# Patient Record
Sex: Female | Born: 1975 | Race: Black or African American | Hispanic: No | Marital: Married | Smoking: Never smoker
Health system: Southern US, Community
[De-identification: ages and names within clinical notes are randomized; demographics above are authoritative.]

## PROBLEM LIST (undated history)

## (undated) DIAGNOSIS — K589 Irritable bowel syndrome without diarrhea: Secondary | ICD-10-CM

## (undated) DIAGNOSIS — F419 Anxiety disorder, unspecified: Secondary | ICD-10-CM

## (undated) DIAGNOSIS — K5900 Constipation, unspecified: Secondary | ICD-10-CM

## (undated) DIAGNOSIS — R600 Localized edema: Secondary | ICD-10-CM

## (undated) DIAGNOSIS — Z803 Family history of malignant neoplasm of breast: Secondary | ICD-10-CM

## (undated) DIAGNOSIS — E0500 Thyrotoxicosis with diffuse goiter without thyrotoxic crisis or storm: Secondary | ICD-10-CM

## (undated) DIAGNOSIS — C801 Malignant (primary) neoplasm, unspecified: Secondary | ICD-10-CM

## (undated) DIAGNOSIS — M2550 Pain in unspecified joint: Secondary | ICD-10-CM

## (undated) DIAGNOSIS — M549 Dorsalgia, unspecified: Secondary | ICD-10-CM

## (undated) DIAGNOSIS — E0590 Thyrotoxicosis, unspecified without thyrotoxic crisis or storm: Secondary | ICD-10-CM

## (undated) DIAGNOSIS — N979 Female infertility, unspecified: Secondary | ICD-10-CM

## (undated) DIAGNOSIS — E559 Vitamin D deficiency, unspecified: Secondary | ICD-10-CM

## (undated) HISTORY — DX: Irritable bowel syndrome, unspecified: K58.9

## (undated) HISTORY — DX: Malignant (primary) neoplasm, unspecified: C80.1

## (undated) HISTORY — DX: Constipation, unspecified: K59.00

## (undated) HISTORY — DX: Thyrotoxicosis, unspecified without thyrotoxic crisis or storm: E05.90

## (undated) HISTORY — DX: Family history of malignant neoplasm of breast: Z80.3

## (undated) HISTORY — DX: Pain in unspecified joint: M25.50

## (undated) HISTORY — PX: ABDOMINAL HYSTERECTOMY: SHX81

## (undated) HISTORY — DX: Localized edema: R60.0

## (undated) HISTORY — PX: APPENDECTOMY: SHX54

## (undated) HISTORY — DX: Anxiety disorder, unspecified: F41.9

## (undated) HISTORY — DX: Female infertility, unspecified: N97.9

## (undated) HISTORY — DX: Vitamin D deficiency, unspecified: E55.9

## (undated) HISTORY — DX: Dorsalgia, unspecified: M54.9

---

## 2003-11-20 DIAGNOSIS — I82409 Acute embolism and thrombosis of unspecified deep veins of unspecified lower extremity: Secondary | ICD-10-CM

## 2003-11-20 HISTORY — DX: Acute embolism and thrombosis of unspecified deep veins of unspecified lower extremity: I82.409

## 2010-11-19 HISTORY — PX: OTHER SURGICAL HISTORY: SHX169

## 2013-11-19 HISTORY — PX: OTHER SURGICAL HISTORY: SHX169

## 2018-12-23 DIAGNOSIS — Z86718 Personal history of other venous thrombosis and embolism: Secondary | ICD-10-CM | POA: Insufficient documentation

## 2019-02-04 ENCOUNTER — Other Ambulatory Visit: Payer: Self-pay

## 2019-02-04 ENCOUNTER — Ambulatory Visit (INDEPENDENT_AMBULATORY_CARE_PROVIDER_SITE_OTHER): Payer: 59 | Admitting: Internal Medicine

## 2019-02-04 ENCOUNTER — Encounter: Payer: Self-pay | Admitting: Internal Medicine

## 2019-02-04 DIAGNOSIS — Z853 Personal history of malignant neoplasm of breast: Secondary | ICD-10-CM | POA: Insufficient documentation

## 2019-02-04 DIAGNOSIS — E0590 Thyrotoxicosis, unspecified without thyrotoxic crisis or storm: Secondary | ICD-10-CM | POA: Diagnosis not present

## 2019-02-04 DIAGNOSIS — E0500 Thyrotoxicosis with diffuse goiter without thyrotoxic crisis or storm: Secondary | ICD-10-CM | POA: Insufficient documentation

## 2019-02-04 LAB — CBC WITH DIFFERENTIAL/PLATELET
BASOS PCT: 0.5 % (ref 0.0–3.0)
Basophils Absolute: 0 10*3/uL (ref 0.0–0.1)
Eosinophils Absolute: 0.1 10*3/uL (ref 0.0–0.7)
Eosinophils Relative: 1 % (ref 0.0–5.0)
HCT: 38.1 % (ref 36.0–46.0)
Hemoglobin: 12.7 g/dL (ref 12.0–15.0)
LYMPHS ABS: 3.5 10*3/uL (ref 0.7–4.0)
Lymphocytes Relative: 40.2 % (ref 12.0–46.0)
MCHC: 33.5 g/dL (ref 30.0–36.0)
MCV: 86.5 fl (ref 78.0–100.0)
Monocytes Absolute: 0.6 10*3/uL (ref 0.1–1.0)
Monocytes Relative: 6.5 % (ref 3.0–12.0)
NEUTROS ABS: 4.5 10*3/uL (ref 1.4–7.7)
Neutrophils Relative %: 51.8 % (ref 43.0–77.0)
PLATELETS: 391 10*3/uL (ref 150.0–400.0)
RBC: 4.4 Mil/uL (ref 3.87–5.11)
RDW: 13.4 % (ref 11.5–15.5)
WBC: 8.6 10*3/uL (ref 4.0–10.5)

## 2019-02-04 LAB — COMPREHENSIVE METABOLIC PANEL
ALT: 6 U/L (ref 0–35)
AST: 11 U/L (ref 0–37)
Albumin: 4.3 g/dL (ref 3.5–5.2)
Alkaline Phosphatase: 67 U/L (ref 39–117)
BUN: 12 mg/dL (ref 6–23)
CO2: 29 mEq/L (ref 19–32)
Calcium: 9.5 mg/dL (ref 8.4–10.5)
Chloride: 103 mEq/L (ref 96–112)
Creatinine, Ser: 0.95 mg/dL (ref 0.40–1.20)
GFR: 78.17 mL/min (ref >60.00)
Glucose, Bld: 91 mg/dL (ref 70–99)
POTASSIUM: 3.7 meq/L (ref 3.5–5.1)
Sodium: 138 mEq/L (ref 135–145)
Total Bilirubin: 0.3 mg/dL (ref 0.2–1.2)
Total Protein: 7.7 g/dL (ref 6.0–8.3)

## 2019-02-04 LAB — T4, FREE: Free T4: 1.04 ng/dL (ref 0.60–1.60)

## 2019-02-04 LAB — TSH: TSH: <0.01 u[IU]/mL — ABNORMAL LOW (ref 0.35–4.50)

## 2019-02-04 NOTE — Progress Notes (Signed)
Name: Madeline Golden  MRN/ DOB: [id number], 10-19-1976    Age/ Sex: 43 y.o., female    PCP: Patient, No Pcp Per   Reason for Endocrinology Evaluation: Hyperthyroidism     Date of Initial Endocrinology Evaluation: 02/04/2019     HPI: [name] is a 43 y.o. female with a past medical history of Graves' disease . The patient presented for initial endocrinology clinic visit on 02/04/2019 for consultative assistance with her hyperthyroidism.   She was diagnosed with hyperthyroidism secondary to graves' disease in 2017, he was initially on methimazole for ~ 1.5 without remission.  She is s/p RAI ablation in 2018 , this was followed by 6 months of biochemical euthyroidism but gradually her TSH started going down and was restarted on methiamzole 5 mg once a day but has been out since 08/2018   Today she denies weight loss, no heat intolerance, anxeity or diarrhea.   Denies local neck symptoms  Has a 35 year old son.   She has noted right eye bulging but no itching or burning  No tobacco use.    HISTORY:   Past Medical History: Graves's Disease, Hx of right breast Ca (S/P right lumpectomy , followed by B/L mastectomy, and chemo)  Past Surgical History:  Past Surgical History:  Procedure Laterality Date   APPENDECTOMY     double mastectomy  2012   hystrectomy with oophrectomy  2015   protective       Social History:  reports that she has never smoked. She has never used smokeless tobacco.  Family History: family history includes Diabetes in her maternal grandmother and mother.   HOME MEDICATIONS: Allergies as of 02/04/2019   Not on File     Medication List       Accurate as of February 04, 2019  1:27 PM. Always use your most recent med list.        methimazole 5 MG tablet Commonly known as:  TAPAZOLE Take 5 mg by mouth 3 (three) times daily.         REVIEW OF SYSTEMS: A comprehensive ROS was conducted with the patient and is negative except as per HPI and  below:  ROS     OBJECTIVE:  VS: BP 116/72 (BP Location: Left Arm, Patient Position: Sitting, Cuff Size: Normal)    Pulse 78    Temp 98 F (36.7 C)    Ht 5\' 7"  (1.702 m)    Wt 223 lb (101.2 kg)    SpO2 98%    BMI 34.93 kg/m    Wt Readings from Last 3 Encounters:  02/04/19 223 lb (101.2 kg)     EXAM: General: Pt appears well and is in NAD  Hydration: Well-hydrated with moist mucous membranes and good skin turgor  Eyes: External eye exam shows b/l stare,no  lid lag , mild right eye exophthalmos.  EOM intact.   Ears, Nose, Throat: Hearing: Grossly intact bilaterally Dental: Good dentition  Throat: Clear without mass, erythema or exudate  Neck: General: Supple without adenopathy. Thyroid: Thyroid size normal.  No goiter or nodules appreciated. No thyroid bruit.  Lungs: Clear with good BS bilat with no rales, rhonchi, or wheezes  Heart: Auscultation: RRR.  Abdomen: Normoactive bowel sounds, soft, nontender, without masses or organomegaly palpable  Extremities:  BL LE: No pretibial edema normal ROM and strength.  Skin: Hair: Texture and amount normal with gender appropriate distribution Skin Inspection: No rashes. Skin Palpation: Skin temperature, texture, and thickness normal to palpation  Neuro: Cranial nerves: II - XII grossly intact  Cerebellar: Normal coordination and movement; no tremor Motor: Normal strength throughout DTRs: 2+ and symmetric in UE without delay in relaxation phase  Mental Status: Judgment, insight: Intact Orientation: Oriented to time, place, and person Mood and affect: No depression, anxiety, or agitation     DATA REVIEWED: 12/24/2018  TSH < 0.01 uIU/mL   Results for Madeline Golden (MRN [id number]) as of 02/05/2019 09:04  Ref. Range 02/04/2019 13:41  Sodium Latest Ref Range: 135 - 145 mEq/L 138  Potassium Latest Ref Range: 3.5 - 5.1 mEq/L 3.7  Chloride Latest Ref Range: 96 - 112 mEq/L 103  CO2 Latest Ref Range: 19 - 32 mEq/L 29  Glucose Latest Ref  Range: 70 - 99 mg/dL 91  BUN Latest Ref Range: 6 - 23 mg/dL 12  Creatinine Latest Ref Range: 0.40 - 1.20 mg/dL 0.95  Calcium Latest Ref Range: 8.4 - 10.5 mg/dL 9.5  Alkaline Phosphatase Latest Ref Range: 39 - 117 U/L 67  Albumin Latest Ref Range: 3.5 - 5.2 g/dL 4.3  AST Latest Ref Range: 0 - 37 U/L 11  ALT Latest Ref Range: 0 - 35 U/L 6  Total Protein Latest Ref Range: 6.0 - 8.3 g/dL 7.7  Total Bilirubin Latest Ref Range: 0.2 - 1.2 mg/dL 0.3  GFR Latest Ref Range: >60.00 mL/min 78.17  WBC Latest Ref Range: 4.0 - 10.5 K/uL 8.6  RBC Latest Ref Range: 3.87 - 5.11 Mil/uL 4.40  Hemoglobin Latest Ref Range: 12.0 - 15.0 g/dL 12.7  HCT Latest Ref Range: 36.0 - 46.0 % 38.1  MCV Latest Ref Range: 78.0 - 100.0 fl 86.5  MCHC Latest Ref Range: 30.0 - 36.0 g/dL 33.5  RDW Latest Ref Range: 11.5 - 15.5 % 13.4  Platelets Latest Ref Range: 150.0 - 400.0 K/uL 391.0  Neutrophils Latest Ref Range: 43.0 - 77.0 % 51.8  Lymphocytes Latest Ref Range: 12.0 - 46.0 % 40.2  Monocytes Relative Latest Ref Range: 3.0 - 12.0 % 6.5  Eosinophil Latest Ref Range: 0.0 - 5.0 % 1.0  Basophil Latest Ref Range: 0.0 - 3.0 % 0.5  NEUT# Latest Ref Range: 1.4 - 7.7 K/uL 4.5  Lymphocyte # Latest Ref Range: 0.7 - 4.0 K/uL 3.5  Monocyte # Latest Ref Range: 0.1 - 1.0 K/uL 0.6  Eosinophils Absolute Latest Ref Range: 0.0 - 0.7 K/uL 0.1  Basophils Absolute Latest Ref Range: 0.0 - 0.1 K/uL 0.0  TSH Latest Ref Range: 0.35 - 4.50 uIU/mL <0.01 (L)  T4,Free(Direct) Latest Ref Range: 0.60 - 1.60 ng/dL 1.04     ASSESSMENT/PLAN/RECOMMENDATIONS:   1. Hyperthyroidism Secondary to Graves' disease:  - She is clinically euthyroid  - S/P RAI ablation in 2018, followed by normal thyroid function for ~ 6 months but had to be restarted on Methimazole due to hyperthyroid status .  - She has been without thionamide therapy since October, 2019 - We discussed that Graves' Disease is a result of an autoimmune condition involving the thyroid.     We discussed with pt the benefits of methimazole in the Tx of hyperthyroidism, as well as the possible side effects/complications of anti-thyroid drug Tx (specifically detailing the rare, but serious side effect of agranulocytosis). She was informed of need for regular thyroid function monitoring while on methimazole to ensure appropriate dosage without over-treatment. As well, we discussed the possible side effects of methimazole including the chance of rash, the small chance of liver irritation/juandice and the <=1 in 300-400 chance of sudden onset  agranulocytosis.  We discussed importance of going to ED promptly (and stopping methimazole) if shewere to develop significant fever with severe sore throat of other evidence of acute infection.      We extensively discussed the various treatment options for hyperthyroidism and Graves disease including ablation therapy with radioactive iodine versus antithyroid drug treatment versus surgical therapy.  We recommended to the patient that we felt, at this time, that I-131 ablation therapy would be most optimal.  We discussed the various possible benefits versus side effects of the various therapies.   I carefully explained to the patient that one of the consequences of I-131 ablation treatment would likely be permanent hypothyroidism which would require long-term replacement therapy with LT4.  - Will proceed with thyroid uptake and scan followed by RAI ablation     F/u in 3 months   Addendum: Discussed results with the patient on 02/04/2019 , will proceed with uptake and scan followed by RAI ablation.   Signed electronically by: Mack Guise, MD  [facility]  [facility] [address] [place], [place] [address] Phone: [phone] FAX: [phone]   CC: Patient, No Pcp Per No address on file Phone: None Fax: None   Return to Endocrinology clinic as below: No future appointments.

## 2019-02-04 NOTE — Patient Instructions (Signed)
-   Please stop by the lab today, if your thyroid continues to be overactive , will proceed with thyroid uptake and scan followed by Radioactive iodine ablation.     Thyroid Uptake and Scan works like this: We would first check a thyroid "scan" (a special, but easy and painless type of thyroid x ray).  you go to the x-ray department of the hospital to swallow a pill, which contains a miniscule amount of radiation.  You will not notice any symptoms from this.  You will go back to the x-ray department the next day, to lie down in front of a camera.  The results of this will be sent to me.

## 2019-02-09 LAB — TRAB (TSH RECEPTOR BINDING ANTIBODY): TRAB: 14.72 IU/L — ABNORMAL HIGH (ref <=2.00)

## 2019-03-24 ENCOUNTER — Encounter (HOSPITAL_COMMUNITY): Payer: 59

## 2019-03-25 ENCOUNTER — Encounter (HOSPITAL_COMMUNITY): Payer: 59

## 2019-04-16 ENCOUNTER — Other Ambulatory Visit: Payer: Self-pay

## 2019-04-16 ENCOUNTER — Encounter (HOSPITAL_COMMUNITY)
Admission: RE | Admit: 2019-04-16 | Discharge: 2019-04-16 | Disposition: A | Discharge status: Home / Self Care | Payer: 59 | Source: Ambulatory Visit | Attending: Internal Medicine | Admitting: Internal Medicine

## 2019-04-16 DIAGNOSIS — E0500 Thyrotoxicosis with diffuse goiter without thyrotoxic crisis or storm: Secondary | ICD-10-CM | POA: Insufficient documentation

## 2019-04-16 MED ORDER — SODIUM IODIDE I 131 CAPSULE
460.0000 | Freq: Once | INTRAVENOUS | Status: AC | PRN
  Administered 2019-04-16: 460 via ORAL

## 2019-04-17 ENCOUNTER — Encounter (HOSPITAL_COMMUNITY)
Admission: RE | Admit: 2019-04-17 | Discharge: 2019-04-17 | Disposition: A | Discharge status: Home / Self Care | Payer: 59 | Source: Ambulatory Visit | Attending: Internal Medicine | Admitting: Internal Medicine

## 2019-04-21 ENCOUNTER — Telehealth: Payer: Self-pay | Admitting: Internal Medicine

## 2019-04-21 NOTE — Telephone Encounter (Signed)
Discussed thyroid up take and scan results.   Results were normal no indication for therapy.   Pt under the impression she received RAI ablation treatment this time but I explained to her with the results being normal , she did not receive any RAI ablation.    We will obtain her records from [place], to confirm prior RAI ablation rather then just a thyroid uptake and scan     Abby Nena Jordan, MD  [facility]  [facility] [address] [place], [place] [address] Phone: [phone] FAX: [phone]

## 2019-05-06 ENCOUNTER — Encounter: Payer: Self-pay | Admitting: Internal Medicine

## 2019-05-06 ENCOUNTER — Ambulatory Visit (INDEPENDENT_AMBULATORY_CARE_PROVIDER_SITE_OTHER): Payer: 59 | Admitting: Internal Medicine

## 2019-05-06 ENCOUNTER — Other Ambulatory Visit: Payer: Self-pay

## 2019-05-06 VITALS — BP 110/80 | HR 74 | Temp 98.2°F | Ht 67.0 in | Wt 223.0 lb

## 2019-05-06 DIAGNOSIS — E0590 Thyrotoxicosis, unspecified without thyrotoxic crisis or storm: Secondary | ICD-10-CM | POA: Diagnosis not present

## 2019-05-06 LAB — TSH: TSH: 0.03 u[IU]/mL — ABNORMAL LOW (ref 0.35–4.50)

## 2019-05-06 LAB — T4, FREE: Free T4: 0.98 ng/dL (ref 0.60–1.60)

## 2019-05-06 NOTE — Patient Instructions (Signed)
-   Please notify us for unexplained weight loss, flutters in your heart, diarrhea, or anxiety with jittery sensation

## 2019-05-06 NOTE — Progress Notes (Signed)
Name: Madeline Golden  MRN/ DOB: [id number], Mar 03, 1976    Age/ Sex: 43 y.o., female     PCP: Patient, No Pcp Per   Reason for Endocrinology Evaluation: hyperthyroidism     Initial Endocrinology Clinic Visit: 02/04/2019    PATIENT IDENTIFIER: [name] is a 43 y.o., female with a past medical history of Graves' Disease, Breat Ca (S/P mastectomy and sx) She has followed with [facility] since 02/04/2019 for consultative assistance with management of her hyperthyroidism.   HISTORICAL SUMMARY:  She was diagnosed with hyperthyroidism secondary to graves' disease in 2017, he was initially on methimazole for ~ 1.5 years without remission.  She was under the impression that she had RAI ablation in 2018 but this was a thyroid uptake and scan, gradually her TSH started going down and was restarted on methiamzole  But by her initial visit to our clinic she was off of methimazole again.   Due to normal FT4 with a suppressed TSH, we proceed with thyroid uptake and scan on 04/16/2019 with normal uptake with a 24-hr of 28.8%   SUBJECTIVE:    Today (05/06/2019):  [name] is here for a 3 month follow up on hyperthyroidism secondary to graves' disease.  She denies any weight loss, palpitation , anxiety or diarrhea.   She has right eye proptosis but has been stable for the past year.   ROS:  As per HPI.   HISTORY:   Past Medical History: No past medical history on file. Past Surgical History:  Past Surgical History:  Procedure Laterality Date  . APPENDECTOMY    . double mastectomy  2012  . hystrectomy with oophrectomy  2015   protective     Social History:  reports that she has never smoked. She has never used smokeless tobacco. No history on file for alcohol and drug. Family History:  Family History  Problem Relation Age of Onset  . Diabetes Mother   . Breast cancer Mother   . Diabetes Maternal Grandmother   . Lung cancer Father      HOME MEDICATIONS:  Allergies as of 05/06/2019   No Known Allergies     Medication List    as of May 06, 2019  1:29 PM   You have not been prescribed any medications.       OBJECTIVE:   PHYSICAL EXAM: VS: BP 110/80 (BP Location: Left Arm, Patient Position: Sitting, Cuff Size: Large)   Pulse 74   Temp 98.2 F (36.8 C) (Oral)   Ht 5\' 7"  (1.702 m)   Wt 223 lb (101.2 kg)   SpO2 97%   BMI 34.93 kg/m    EXAM: General: Pt appears well and is in NAD  Eyes: External eye exam shows minimal right eye protosis with a stare, but no  lid lag Left eye exam normal   EOM intact.  PERRL.  Ears, Nose, Throat: Hearing: Grossly intact bilaterally Dental: Good dentition  Throat: Clear without mass, erythema or exudate  Neck: General: Supple without adenopathy. Thyroid: Thyroid size normal.  No goiter or nodules appreciated. No thyroid bruit.  Lungs: Clear with good BS bilat with no rales, rhonchi, or wheezes  Heart: Auscultation: RRR.  Abdomen: Normoactive bowel sounds, soft, nontender, without masses or organomegaly palpable  Extremities:  BL LE: No pretibial edema normal ROM and strength.  Mental Status: Judgment, insight: Intact Orientation: Oriented to time, place, and person Mood and affect: No depression, anxiety, or agitation     DATA REVIEWED:  12/24/2018 TSH < 0.01 uIU/mL   Thyroid uptake and scan 04/16/2019 24 hour I-123 uptake = 28.8% (normal 10-30%)   Results for Madeline, Golden (MRN [id number]) as of 05/07/2019 12:04  Ref. Range 05/06/2019 13:48  TSH Latest Ref Range: 0.35 - 4.50 uIU/mL 0.03 (L)  Triiodothyronine (T3) Latest Ref Range: 76 - 181 ng/dL 119  T4,Free(Direct) Latest Ref Range: 0.60 - 1.60 ng/dL 0.98   ASSESSMENT / PLAN / RECOMMENDATIONS:   1. Subclinical Graves' Disease:   - Clinically she is euthyroid - No local neck symptoms.  - Thyroid uptake and scan 03/2019 was normal.  - TFT's stable, no intervention needed   2. Graves' Disease:  Pt with evidence of right eye  proptosis, she was urged to establish with a local ophthalmologist.   F/u in 6 months    Signed electronically by: Mack Guise, MD  [facility]  [facility] [address] [place], [place] [address] Phone: [phone] FAX: [phone]      CC: Patient, No Pcp Per No address on file Phone: None  Fax: None   Return to Endocrinology clinic as below: No future appointments.

## 2019-05-07 ENCOUNTER — Encounter: Payer: Self-pay | Admitting: Internal Medicine

## 2019-05-07 LAB — T3: T3, Total: 119 ng/dL (ref 76–181)

## 2019-11-04 ENCOUNTER — Other Ambulatory Visit: Payer: Self-pay

## 2019-11-06 ENCOUNTER — Other Ambulatory Visit: Payer: Self-pay

## 2019-11-06 ENCOUNTER — Ambulatory Visit: Payer: 59 | Admitting: Internal Medicine

## 2019-11-06 ENCOUNTER — Encounter: Payer: Self-pay | Admitting: Internal Medicine

## 2019-11-06 VITALS — BP 116/72 | HR 77 | Temp 98.3°F | Ht 67.0 in | Wt 227.6 lb

## 2019-11-06 DIAGNOSIS — E0500 Thyrotoxicosis with diffuse goiter without thyrotoxic crisis or storm: Secondary | ICD-10-CM | POA: Diagnosis not present

## 2019-11-06 LAB — CBC WITH DIFFERENTIAL/PLATELET
Basophils Absolute: 0.1 10*3/uL (ref 0.0–0.1)
Basophils Relative: 0.6 % (ref 0.0–3.0)
Eosinophils Absolute: 0.1 10*3/uL (ref 0.0–0.7)
Eosinophils Relative: 0.8 % (ref 0.0–5.0)
HCT: 35.7 % — ABNORMAL LOW (ref 36.0–46.0)
Hemoglobin: 12 g/dL (ref 12.0–15.0)
Lymphocytes Relative: 39.2 % (ref 12.0–46.0)
Lymphs Abs: 4.1 10*3/uL — ABNORMAL HIGH (ref 0.7–4.0)
MCHC: 33.6 g/dL (ref 30.0–36.0)
MCV: 86.1 fl (ref 78.0–100.0)
Monocytes Absolute: 0.5 10*3/uL (ref 0.1–1.0)
Monocytes Relative: 5 % (ref 3.0–12.0)
Neutro Abs: 5.7 10*3/uL (ref 1.4–7.7)
Neutrophils Relative %: 54.4 % (ref 43.0–77.0)
Platelets: 385 10*3/uL (ref 150.0–400.0)
RBC: 4.15 Mil/uL (ref 3.87–5.11)
RDW: 13.9 % (ref 11.5–15.5)
WBC: 10.5 10*3/uL (ref 4.0–10.5)

## 2019-11-06 LAB — COMPREHENSIVE METABOLIC PANEL
ALT: 5 U/L (ref 0–35)
AST: 12 U/L (ref 0–37)
Albumin: 4.3 g/dL (ref 3.5–5.2)
Alkaline Phosphatase: 62 U/L (ref 39–117)
BUN: 9 mg/dL (ref 6–23)
CO2: 27 mEq/L (ref 19–32)
Calcium: 9.4 mg/dL (ref 8.4–10.5)
Chloride: 103 mEq/L (ref 96–112)
Creatinine, Ser: 0.95 mg/dL (ref 0.40–1.20)
GFR: 77.89 mL/min (ref >60.00)
Glucose, Bld: 97 mg/dL (ref 70–99)
Potassium: 3.7 mEq/L (ref 3.5–5.1)
Sodium: 136 mEq/L (ref 135–145)
Total Bilirubin: 0.4 mg/dL (ref 0.2–1.2)
Total Protein: 7.5 g/dL (ref 6.0–8.3)

## 2019-11-06 LAB — TSH: TSH: 0.19 u[IU]/mL — ABNORMAL LOW (ref 0.35–4.50)

## 2019-11-06 LAB — T4, FREE: Free T4: 0.89 ng/dL (ref 0.60–1.60)

## 2019-11-06 NOTE — Progress Notes (Signed)
Name: Madeline Golden  MRN/ DOB: [id number], 1976-04-30    Age/ Sex: 43 y.o., female     PCP: Patient, No Pcp Per   Reason for Endocrinology Evaluation: hyperthyroidism     Initial Endocrinology Clinic Visit: 02/04/2019    PATIENT IDENTIFIER: [name] is a 43 y.o., female with a past medical history of Graves' Disease, Breat Ca (S/P mastectomy and sx) She has followed with [facility] since 02/04/2019 for consultative assistance with management of her hyperthyroidism.   HISTORICAL SUMMARY:  She was diagnosed with hyperthyroidism secondary to graves' disease in 2017, he was initially on methimazole for ~ 1.5 years without remission.  She was under the impression that she had RAI ablation in 2018 but this was a thyroid uptake and scan, gradually her TSH started going down and was restarted on methiamzole  but by her initial visit to our clinic she was off of methimazole again.   Due to normal FT4 with a suppressed TSH, we proceed with thyroid uptake and scan on 04/16/2019 with normal uptake with a 24-hr of 28.8%   SUBJECTIVE:   During last visit (05/06/2019): TSH 0.03 uIU/mL with normal FT4. No treatment offered.   Today (11/09/2019):  [name] is here for a 6 month follow up on hyperthyroidism secondary to graves' disease.  She denies any weight loss, palpitation , anxiety or diarrhea.   She has right eye proptosis but has been stable for the past years  Denies local neck symptoms   She is S/P hystrectomy/oophrectomy  in 2015  ROS:  As per HPI.   HISTORY:   Past Medical History: No past medical history on file. Past Surgical History:  Past Surgical History:  Procedure Laterality Date  . APPENDECTOMY    . double mastectomy  2012  . hystrectomy with oophrectomy  2015   protective     Social History:  reports that she has never smoked. She has never used smokeless tobacco. No history on file for alcohol and drug. Family History:  Family History    Problem Relation Age of Onset  . Diabetes Mother   . Breast cancer Mother   . Diabetes Maternal Grandmother   . Lung cancer Father      HOME MEDICATIONS: Allergies as of 11/06/2019   No Known Allergies     Medication List    as of November 06, 2019 11:59 PM   You have not been prescribed any medications.       OBJECTIVE:   PHYSICAL EXAM: VS: BP 116/72 (BP Location: Left Arm, Patient Position: Sitting, Cuff Size: Large)   Pulse 77   Temp 98.3 F (36.8 C)   Ht 5\' 7"  (1.702 m)   Wt 227 lb 9.6 oz (103.2 kg)   SpO2 98%   BMI 35.65 kg/m    EXAM: General: Pt appears well and is in NAD  Eyes: External eye exam shows right eye protosis with a stare, but no  lid lag Left eye exam normal   EOM intact.    Neck: General: Supple without adenopathy. Thyroid: Thyroid size normal.  No goiter or nodules appreciated. No thyroid bruit.  Lungs: Clear with good BS bilat with no rales, rhonchi, or wheezes  Heart: Auscultation: RRR.  Abdomen: Normoactive bowel sounds, soft, nontender, without masses or organomegaly palpable  Extremities:  BL LE: No pretibial edema normal ROM and strength.  Mental Status: Judgment, insight: Intact Orientation: Oriented to time, place, and person Mood and affect: No depression, anxiety, or  agitation     DATA REVIEWED: Results for CAPRI, HUPPERT (MRN [id number]) as of 11/09/2019 09:21  Ref. Range 11/06/2019 13:36  TSH Latest Ref Range: 0.35 - 4.50 uIU/mL 0.19 (L)  T4,Free(Direct) Latest Ref Range: 0.60 - 1.60 ng/dL 0.89    Results for DALAYAH, STALLING (MRN [id number]) as of 11/09/2019 09:21  Ref. Range 02/04/2019 13:41  TRAB Latest Ref Range: <=2.00 IU/L 14.72 (H)    Thyroid uptake and scan 04/16/2019 24 hour I-123 uptake = 28.8% (normal 10-30%)     ASSESSMENT / PLAN / RECOMMENDATIONS:   1. Subclinical Graves' Disease:   - Clinically she is euthyroid - No local neck symptoms.  - Thyroid uptake and scan 03/2019 was normal but had elevated  TRAb - Most patients with subclinical hyperthyroidism have no clinical manifestations of hyperthyroidism, and those symptoms that are present (eg, tachycardia, tremor, dyspnea on exertion, weight loss) are mild and nonspecific." However, subclinical hyperthyroidism is associated with an increased risk of atrial fibrillation, and primarily in postmenopausal women, a decrease in bone mineral density. Since the patient is S/P oophrecotmy in 2015, she is at high risk for worsening BMD and I have recommended thionamide therapy if TSH remains low as she has not went in to remission for the past year.    Medications  Methimazole 5 mg daily   2. Graves' Disease:  Pt with evidence of right eye proptosis, she was urged again to establish with a local ophthalmologist.   F/u in 6 months   Addendum: discussed results with the pt on 11/09/2019. Agreed to start methimazole    Signed electronically by: Mack Guise, MD  [facility] End[facility]  [facility] [address] [place], [place] [address] Phone: [phone] FAX: [phone]      CC: Patient, No Pcp Per No address on file Phone: None  Fax: None   Return to Endocrinology clinic as below: Future Appointments  Date Time Provider [place]  05/13/2020  1:00 PM Joelle Flessner, Melanie Crazier, MD [id number] None

## 2019-11-09 ENCOUNTER — Encounter: Payer: Self-pay | Admitting: Internal Medicine

## 2019-11-09 MED ORDER — METHIMAZOLE 5 MG PO TABS: 5.0000 mg | ORAL_TABLET | Freq: Three times a day (TID) | ORAL | 1 refills | Status: DC

## 2020-03-10 ENCOUNTER — Ambulatory Visit
Admission: EM | Admit: 2020-03-10 | Discharge: 2020-03-10 | Disposition: A | Discharge status: Home / Self Care | Payer: 59 | Attending: Emergency Medicine | Admitting: Emergency Medicine

## 2020-03-10 DIAGNOSIS — M546 Pain in thoracic spine: Secondary | ICD-10-CM

## 2020-03-10 DIAGNOSIS — G8929 Other chronic pain: Secondary | ICD-10-CM | POA: Diagnosis not present

## 2020-03-10 HISTORY — DX: Thyrotoxicosis with diffuse goiter without thyrotoxic crisis or storm: E05.00

## 2020-03-10 MED ORDER — NAPROXEN 500 MG PO TABS: 500.0000 mg | ORAL_TABLET | Freq: Two times a day (BID) | ORAL | 0 refills | Status: DC

## 2020-03-10 MED ORDER — CYCLOBENZAPRINE HCL 5 MG PO TABS: 5.0000 mg | ORAL_TABLET | Freq: Two times a day (BID) | ORAL | 0 refills | Status: AC | PRN

## 2020-03-10 NOTE — ED Triage Notes (Signed)
Pt c/o rt upper/mid back pain for the past 77months that has gradually gotten worse. Pt denies injury.

## 2020-03-10 NOTE — ED Provider Notes (Signed)
[name]    CSN: [id number] Arrival date & time: 03/10/20  1730      History   Chief Complaint Chief Complaint  Patient presents with  . Back Pain    HPI Madeline Golden is a 44 y.o. female with history of hypothyroidism second to Graves' disease, history of breast cancer presenting for right-sided thoracic back pain for the last 3 months.  States is gradually gotten worse.  Denies injury, inciting event, overuse.  Has tried her mother's hydrocodone with some relief.  States she rarely uses this.  Has tried icy hot as well without relief.  Denies chest pain, difficulty breathing, unintentional weight loss or night sweats.  Patient compliant with methimazole and followed routinely by her endocrinologist: No recent change in medications.   Past Medical History:  Diagnosis Date  . Graves disease     Patient Active Problem List   Diagnosis Date Noted  . Hyperthyroidism 02/04/2019  . Graves disease 02/04/2019  . Hx of breast cancer 02/04/2019    Past Surgical History:  Procedure Laterality Date  . ABDOMINAL HYSTERECTOMY    . APPENDECTOMY    . double mastectomy  2012  . hystrectomy with oophrectomy  2015   protective     OB History   No obstetric history on file.      Home Medications    Prior to Admission medications   Medication Sig Start Date End Date Taking? Authorizing Provider  cyclobenzaprine (FLEXERIL) 5 MG tablet Take 1 tablet (5 mg total) by mouth 2 (two) times daily as needed for up to 5 days for muscle spasms. 03/10/20 03/15/20  Hall-Potvin, [place], PA-C  methimazole (TAPAZOLE) 5 MG tablet Take 1 tablet (5 mg total) by mouth 3 (three) times daily. 11/09/19   Shamleffer, Melanie Crazier, MD  naproxen (NAPROSYN) 500 MG tablet Take 1 tablet (500 mg total) by mouth 2 (two) times daily. 03/10/20   Hall-Potvin, [place], PA-C    Family History Family History  Problem Relation Age of Onset  . Diabetes Mother   . Breast cancer Mother   . Diabetes  Maternal Grandmother   . Lung cancer Father     Social History Social History   Tobacco Use  . Smoking status: Never Smoker  . Smokeless tobacco: Never Used  Substance Use Topics  . Alcohol use: Yes  . Drug use: Never     Allergies   Patient has no known allergies.   Review of Systems As per HPI   Physical Exam Triage Vital Signs ED Triage Vitals  Enc Vitals Group     BP      Pulse      Resp      Temp      Temp src      SpO2      Weight      Height      Head Circumference      Peak Flow      Pain Score      Pain Loc      Pain Edu?      Excl. in [place]?    No data found.  Updated Vital Signs BP 119/79 (BP Location: Left Arm)   Pulse 80   Temp 98.3 F (36.8 C) (Oral)   Resp 16   SpO2 98%   Visual Acuity Right Eye Distance:   Left Eye Distance:   Bilateral Distance:    Right Eye Near:   Left Eye Near:    Bilateral Near:  Physical Exam Constitutional:      General: She is not in acute distress.    Appearance: She is obese. She is not ill-appearing.  HENT:     Head: Normocephalic and atraumatic.  Eyes:     General: No scleral icterus.    Pupils: Pupils are equal, round, and reactive to light.  Cardiovascular:     Rate and Rhythm: Normal rate and regular rhythm.     Heart sounds: Normal heart sounds.  Pulmonary:     Effort: Pulmonary effort is normal. No respiratory distress.     Breath sounds: No wheezing or rales.  Musculoskeletal:     Cervical back: Neck supple. No tenderness.     Comments: Thoracic back pain right of midline.  No edema, crepitus.  Moderate muscle tension appreciated as compared to left.  Lymphadenopathy:     Cervical: No cervical adenopathy.  Skin:    Capillary Refill: Capillary refill takes less than 2 seconds.     Coloration: Skin is not jaundiced or pale.  Neurological:     General: No focal deficit present.     Mental Status: She is alert and oriented to person, place, and time.      UC Treatments / Results    Labs (all labs ordered are listed, but only abnormal results are displayed) Labs Reviewed - No data to display  EKG   Radiology No results found.  Procedures Procedures (including critical care time)  Medications Ordered in UC Medications - No data to display  Initial Impression / Assessment and Plan / UC Course  I have reviewed the triage vital signs and the nursing notes.  Pertinent labs & imaging results that were available during my care of the patient were reviewed by me and considered in my medical decision making (see chart for details).     Patient afebrile, nontoxic, and hemodynamically stable in office today.  No systemic symptoms as outlined in HPI.  Will treat for musculoskeletal as outlined below, have patient follow-up with PCP for further evaluation if needed.  Contact information provided.  Return precautions discussed, patient verbalized understanding and is agreeable to plan. Final Clinical Impressions(s) / UC Diagnoses   Final diagnoses:  Chronic right-sided thoracic back pain     Discharge Instructions     Recommend RICE: rest, ice, compression, elevation as needed for pain.    Heat therapy (hot compress, warm wash rag, hot showers, etc.) can help relax muscles and soothe muscle aches. Cold therapy (ice packs) can be used to help swelling both after injury and after prolonged use of areas of chronic pain/aches.  For pain: take naproxen twice daily with food    ED Prescriptions    Medication Sig Dispense Auth. Provider   naproxen (NAPROSYN) 500 MG tablet Take 1 tablet (500 mg total) by mouth 2 (two) times daily. 30 tablet Hall-Potvin, [place], PA-C   cyclobenzaprine (FLEXERIL) 5 MG tablet Take 1 tablet (5 mg total) by mouth 2 (two) times daily as needed for up to 5 days for muscle spasms. 10 tablet Hall-Potvin, [place], PA-C     I have reviewed the PDMP during this encounter.   Hall-Potvin, [place], [place] 03/10/20 1820

## 2020-03-10 NOTE — Discharge Instructions (Addendum)
Recommend RICE: rest, ice, compression, elevation as needed for pain.    Heat therapy (hot compress, warm wash rag, hot showers, etc.) can help relax muscles and soothe muscle aches. Cold therapy (ice packs) can be used to help swelling both after injury and after prolonged use of areas of chronic pain/aches.  For pain: take naproxen twice daily with food

## 2020-03-28 ENCOUNTER — Encounter: Payer: Self-pay | Admitting: Internal Medicine

## 2020-03-29 ENCOUNTER — Telehealth: Payer: Self-pay

## 2020-03-29 NOTE — Telephone Encounter (Signed)

## 2020-03-29 NOTE — Patient Instructions (Signed)
Thank you for choosing Primary Care at [facility] to be your medical home!    Bernelda Bynog was seen by Melina Schools, DO today.   Earline Mayotte primary care provider is Phill Myron, DO.   For the best care possible, you should try to see Phill Myron, DO whenever you come to the clinic.   We look forward to seeing you again soon!  If you have any questions about your visit today, please call us at [phone] or feel free to reach your primary care provider via [facility].

## 2020-03-30 ENCOUNTER — Ambulatory Visit (INDEPENDENT_AMBULATORY_CARE_PROVIDER_SITE_OTHER): Payer: 59 | Admitting: Internal Medicine

## 2020-03-30 ENCOUNTER — Ambulatory Visit (INDEPENDENT_AMBULATORY_CARE_PROVIDER_SITE_OTHER): Payer: 59

## 2020-03-30 ENCOUNTER — Encounter: Payer: Self-pay | Admitting: Internal Medicine

## 2020-03-30 ENCOUNTER — Other Ambulatory Visit: Payer: Self-pay

## 2020-03-30 VITALS — BP 111/76 | HR 80 | Temp 98.2°F | Resp 17 | Ht 70.0 in | Wt 223.0 lb

## 2020-03-30 DIAGNOSIS — M546 Pain in thoracic spine: Secondary | ICD-10-CM

## 2020-03-30 DIAGNOSIS — M25561 Pain in right knee: Secondary | ICD-10-CM | POA: Diagnosis not present

## 2020-03-30 DIAGNOSIS — G8929 Other chronic pain: Secondary | ICD-10-CM

## 2020-03-30 DIAGNOSIS — Z7689 Persons encountering health services in other specified circumstances: Secondary | ICD-10-CM | POA: Diagnosis not present

## 2020-03-30 MED ORDER — CYCLOBENZAPRINE HCL 5 MG PO TABS: 5.0000 mg | ORAL_TABLET | Freq: Three times a day (TID) | ORAL | 1 refills | Status: DC | PRN

## 2020-03-30 NOTE — Progress Notes (Signed)
  Subjective:    Madeline Golden - 44 y.o. female MRN [id number]  Date of birth: Jul 01, 1976  HPI  Rigley Prem is to establish care. Patient has a PMH significant for breast cancer, Graves disease, unprovoked DVT in 2005.    Back Pain: Chronic pain present on right side in thoracic region. Present daily throughout the day. Has been present for 3-4 months. No known injury or trauma. Reports that initially noticed it at first when she was in the bed. No changes in bowel or bladder. No fevers. Diagnosed with breast cancer in 2009. Finished treatment in 2013. Taking Flexeril and Naproxen with some relief.   Right knee pain. Reports it is "raggedy". It comes and goes. She will hear popping and crunching. Occasionally will swell. Chronic for years.   ROS per HPI     Health Maintenance:  Health Maintenance Due  Topic Date Due  . COVID-19 Vaccine (1) Never done  . TETANUS/TDAP  Never done     Past Medical History: Patient Active Problem List   Diagnosis Date Noted  . Hyperthyroidism 02/04/2019  . Graves disease 02/04/2019  . Hx of breast cancer 02/04/2019      Social History   reports that she has never smoked. She has never used smokeless tobacco. She reports current alcohol use. She reports that she does not use drugs.   Family History  family history includes Bone cancer in her father; Breast cancer in her maternal grandmother and mother; Diabetes in her maternal grandmother and mother; Lung cancer in her father and paternal aunt.   Medications: reviewed and updated   Objective:   Physical Exam BP 111/76   Pulse 80   Temp 98.2 F (36.8 C) (Temporal)   Resp 17   Ht 5\' 10"  (1.778 m)   Wt 223 lb (101.2 kg)   SpO2 97%   BMI 32.00 kg/m  Physical Exam  Constitutional: She is oriented to person, place, and time and well-developed, well-nourished, and in no distress. No distress.  Cardiovascular: Normal rate.  Pulmonary/Chest: Effort normal. No respiratory distress.   Musculoskeletal:     Comments: TTP over musculature adjacent to thoracic spine, right sided. Has full ROM at T spine but pain worsens with back flexion.  Right knee with crepitus. No edema or erythema. Mild TTP over the lateral joint line. No pain/laxity with valgus or varus stress testing. Neg anterior drawer. ?Mildly positive Thessaly's test.   Neurological: She is alert and oriented to person, place, and time.  Skin: Skin is warm and dry. She is not diaphoretic.  Psychiatric: Affect and judgment normal.        Assessment & Plan:   1. Encounter to establish care Reviewed patient's PMH, social history, surgical history, and medications.   2. Chronic right-sided thoracic back pain Will obtain imaging given chronicity and history of breast cancer. Will refer to orthopedics for further management.  - DG Thoracic Spine 2 View; Future - Ambulatory referral to Orthopedic Surgery - cyclobenzaprine (FLEXERIL) 5 MG tablet; Take 1 tablet (5 mg total) by mouth 3 (three) times daily as needed for muscle spasms.  Dispense: 30 tablet; Refill: 1  3. Chronic pain of right knee Exam and history most concerning for OA. Some findings questionable of chronic meniscal injury. Will obtain x-ray and refer to orthopedics.  - Ambulatory referral to Orthopedic Surgery - DG Knee Complete 4 Views Right; Future     Phill Myron, D.O. 03/30/2020, 2:03 PM Primary Care at [facility]

## 2020-04-01 NOTE — Progress Notes (Signed)
Patient notified of results & recommendations. Expressed understanding. Patient is scheduled to see Ortho on 04/04/2020.

## 2020-04-04 ENCOUNTER — Other Ambulatory Visit: Payer: Self-pay

## 2020-04-04 ENCOUNTER — Encounter: Payer: Self-pay | Admitting: Family Medicine

## 2020-04-04 ENCOUNTER — Ambulatory Visit: Payer: 59 | Admitting: Family Medicine

## 2020-04-04 DIAGNOSIS — M25561 Pain in right knee: Secondary | ICD-10-CM | POA: Diagnosis not present

## 2020-04-04 DIAGNOSIS — M546 Pain in thoracic spine: Secondary | ICD-10-CM

## 2020-04-04 DIAGNOSIS — G8929 Other chronic pain: Secondary | ICD-10-CM

## 2020-04-04 NOTE — Progress Notes (Signed)
Madeline Golden - 44 y.o. female MRN [id number]  Date of birth: 25-Jul-1976  Office Visit Note: Visit Date: 04/04/2020 PCP: Nicolette Bang, DO Referred by: Caryl Never*  Subjective: Chief Complaint  Patient presents with  . Middle Back - Pain    Fell off top bunk, while she was in the [employer]. Hurt her back then. Has had intermittent pain in the middle part of her back since then, worsening over the past several months. H/o breast cx. Most of the pain is at night, lying on right side.  . Right Knee - Pain    Intermittent pain in the knee x years. Worse lately, with grinding and swelling.    HPI: Madeline Golden is a 44 y.o. female who comes in today with lower and mid back pain for the past 20 years. She reports that she has had general back pain since falling off the top bunk in the [employer] 25 years ago. She has been to PT intermittently with no relief. In the past several months, her pain has worsened and has become localized to a specific spot in her lower back. She feels this daily, especially when standing for long periods of time. Worse with bending forward. She pain and muscle spasms at night when lying in bed she has been taking naproxen and flexeril prescribed by PCP and this is helping.   Her back pain mainly concerns her as she has a history of breast cancer and her father had bone marrow cancer that presented as back pain.   Right knee pain- gradually worsening over the years, intermittent pain on medial aspect of knee. She hears popping/clicking. Knee swells and occasionally feels locked. No acute injury.   She is a Engineer, structural, recently moved from [place].    ROS Otherwise per HPI.  Assessment & Plan: Visit Diagnoses:  1. Pain in thoracic spine   2. Chronic pain of right knee     Plan:  Back pain- spurring in lateral thoracic spine on x-ray from 5/12, but pain on exam appears localized to upper lumbar paraspinal muscles, worse on the  left side with trigger points appreciated. Will refer to [facility] for myofascial work and exercises. If pain does not improve, will obtain MRI as she has history of breast cancer and family history of bone marrow cancer.  Right knee pain- moderate medial compartment and patellofemoral DJD on xray from 5/12, with exam findings of possible meniscal pain although no specific injury. Injected with cortisone today with good pain relief. Will start with PT, glucosamine and tumeric. Consider gel injections in the future.    Follow-up: PRN  Procedures: Procedure performed: knee intraarticular corticosteroid injection; palpation guided  Noted no overlying erythema, induration, or other signs of local infection. The right superior-lateral patellar space was palpated and marked. The overlying skin was prepped in a sterile fashion. Topical analgesic spray: Ethyl chloride. Joint: right knee Needle: 25 gauge, 1.5 inch Completed without difficulty. Meds: 40 mg methylrprednisolone, 4 ml 1% lidocaine without epinephrine    Clinical History: No specialty comments available.   She reports that she has never smoked. She has never used smokeless tobacco. No results for input(s): HGBA1C, LABURIC in the last 8760 hours.  Objective:  VS:  HT:    WT:   BMI:     BP:   HR: bpm  TEMP: ( )  RESP:  Physical Exam  PHYSICAL EXAM: Gen: NAD, alert, cooperative with exam, well-appearing HEENT: clear conjunctiva,  CV:  no edema, capillary refill brisk, normal rate Resp: non-labored Skin: no rashes, normal turgor  Neuro: no gross deficits.  Psych:  alert and oriented  Ortho Exam  Lumbar spine: - Inspection: no gross deformity or asymmetry, swelling or ecchymosis - Palpation: TTP over L3-L4 paraspinal muscles (L>R) with tense spasm noted - ROM: full active ROM of the lumbar spine in flexion and extension (some pain with extension) - Strength: 5/5 strength of lower extremity in L4-S1 nerve root  distributions b/l; normal gait - Neuro: sensation intact in the L4-S1 nerve root distribution b/l  Imaging: None today  Past Medical/Family/Surgical/Social History: Medications & Allergies reviewed per EMR, new medications updated. Patient Active Problem List   Diagnosis Date Noted  . Hyperthyroidism 02/04/2019  . Graves disease 02/04/2019  . Hx of breast cancer 02/04/2019   Past Medical History:  Diagnosis Date  . DVT (deep venous thrombosis) ([place]) 2005   No known cause   . Graves disease    Family History  Problem Relation Age of Onset  . Diabetes Mother   . Breast cancer Mother   . Diabetes Maternal Grandmother   . Breast cancer Maternal Grandmother   . Lung cancer Father   . Bone cancer Father   . Lung cancer Paternal Aunt    Past Surgical History:  Procedure Laterality Date  . ABDOMINAL HYSTERECTOMY    . APPENDECTOMY    . double mastectomy  2012  . hystrectomy with oophrectomy  2015   protective    Social History   Occupational History  . Not on file  Tobacco Use  . Smoking status: Never Smoker  . Smokeless tobacco: Never Used  Substance and Sexual Activity  . Alcohol use: Yes  . Drug use: Never  . Sexual activity: Not on file

## 2020-04-04 NOTE — Progress Notes (Signed)
I saw and examined the patient with Dr. Mayer Masker and agree with assessment and plan as outlined.    Chronic back pain since falling off a bunk in the [employer].  Recently pain is worst to the left of midline in the upper lumbar paraspinous muscles.  Family history of bone marrow cancer.  Personal history of breast cancer.  No red flag symptoms.  Recent x-rays show diffuse thoracic lateral spurring on the right.  Will try PT at [facility].  MRI if fails to improve.  Also chronic right knee pain.  X-Rays show moderate tricompartmental DJD.  Will inject with cortisone today.  Start PT.  Glucosamine sulfate.  Could do gel injections in the future.

## 2020-04-04 NOTE — Patient Instructions (Signed)
   Glucosamine Sulfate:  1,000 mg twice daily    

## 2020-05-13 ENCOUNTER — Encounter: Payer: Self-pay | Admitting: Internal Medicine

## 2020-05-13 ENCOUNTER — Other Ambulatory Visit: Payer: Self-pay

## 2020-05-13 ENCOUNTER — Ambulatory Visit: Payer: 59 | Admitting: Internal Medicine

## 2020-05-13 VITALS — BP 128/72 | HR 76 | Ht 70.0 in | Wt 223.0 lb

## 2020-05-13 DIAGNOSIS — E0590 Thyrotoxicosis, unspecified without thyrotoxic crisis or storm: Secondary | ICD-10-CM

## 2020-05-13 LAB — T4, FREE: Free T4: 0.96 ng/dL (ref 0.60–1.60)

## 2020-05-13 LAB — TSH: TSH: 0.82 u[IU]/mL (ref 0.35–4.50)

## 2020-05-13 MED ORDER — METHIMAZOLE 5 MG PO TABS: 5.0000 mg | ORAL_TABLET | Freq: Every day | ORAL | 1 refills | Status: DC

## 2020-05-13 NOTE — Progress Notes (Signed)
Name: Madeline Golden  MRN/ DOB: [id number], 24-Jan-1976    Age/ Sex: 44 y.o., female     PCP: Madeline Bang, DO   Reason for Endocrinology Evaluation: hyperthyroidism     Initial Endocrinology Clinic Visit: 02/04/2019    PATIENT IDENTIFIER: [name] is a 44 y.o., female with a past medical history of Graves' Disease, Breat Ca (S/P mastectomy and sx) She has followed with [facility] since 02/04/2019 for consultative assistance with management of her hyperthyroidism.   HISTORICAL SUMMARY:  She was diagnosed with hyperthyroidism secondary to graves' disease in 2017, he was initially on methimazole for ~ 1.5 years without remission.  She was under the impression that she had RAI ablation in 2018 but this was a thyroid uptake and scan, gradually her TSH started going down and was restarted on methiamzole  but by her initial visit to our clinic she was off of methimazole again.   Due to normal FT4 with a suppressed TSH, we proceed with thyroid uptake and scan on 04/16/2019 with normal uptake with a 24-hr of 28.8%  Methimazole started 10/2019 SUBJECTIVE:    Today (05/13/2020):  Madeline Golden is here for a 6 month follow up on hyperthyroidism secondary to graves' disease.  She denies any weight loss, palpitation , anxiety or diarrhea.   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   HISTORY:  Past Medical History:  Past Medical History:  Diagnosis Date   DVT (deep venous thrombosis) ([place]) 2005   No known cause    Graves disease    Past Surgical History:  Past Surgical History:  Procedure Laterality Date   ABDOMINAL HYSTERECTOMY     APPENDECTOMY     double mastectomy  2012   hystrectomy with oophrectomy  2015   protective     Social History:  reports that she has never smoked. She has never used  smokeless tobacco. She reports current alcohol use. She reports that she does not use drugs. Family History:  Family History  Problem Relation Age of Onset   Diabetes Mother    Breast cancer Mother    Diabetes Maternal Grandmother    Breast cancer Maternal Grandmother    Lung cancer Father    Bone cancer Father    Lung cancer Paternal Aunt      HOME MEDICATIONS: Allergies as of 05/13/2020   No Known Allergies     Medication List       Accurate as of May 13, 2020  1:34 PM. If you have any questions, ask your nurse or doctor.        cyclobenzaprine 5 MG tablet Commonly known as: FLEXERIL Take 1 tablet (5 mg total) by mouth 3 (three) times daily as needed for muscle spasms.   methimazole 5 MG tablet Commonly known as: TAPAZOLE Take 1 tablet (5 mg total) by mouth daily. What changed: when to take this Changed by: Madeline Sciara, MD   multivitamin tablet Take 1 tablet by mouth daily.   naproxen 500 MG tablet Commonly known as: NAPROSYN Take 1 tablet (500 mg total) by mouth 2 (two) times daily.   vitamin C 1000 MG tablet Take 1,000 mg by mouth daily.         OBJECTIVE:   PHYSICAL EXAM: VS: BP 128/72 (BP Location: Left  Arm, Patient Position: Sitting, Cuff Size: Large)    Pulse 76    Ht 5\' 10"  (1.778 m)    Wt 223 lb (101.2 kg)    SpO2 99%    BMI 32.00 kg/m    EXAM: General: Pt appears well and is in NAD  Eyes: External eye exam shows right eye protosis with a stare, but no  lid lag Left eye exam normal   Neck: General: Supple without adenopathy. Thyroid: Thyroid size normal.  No goiter or nodules appreciated. No thyroid bruit.  Lungs: Clear with good BS bilat with no rales, rhonchi, or wheezes  Heart: Auscultation: RRR.  Abdomen: nontender  Extremities:  BL LE: No pretibial edema normal ROM and strength.  Mental Status: Judgment, insight: Intact Orientation: Oriented to time, place, and person Mood and affect: No depression, anxiety, or  agitation     DATA REVIEWED:  Results for Madeline, Golden (MRN [id number]) as of 05/16/2020 07:19  Ref. Range 05/13/2020 13:46  TSH Latest Ref Range: 0.35 - 4.50 uIU/mL 0.82  T4,Free(Direct) Latest Ref Range: 0.60 - 1.60 ng/dL 0.96    Results for Madeline, Golden (MRN [id number]) as of 11/09/2019 09:21  Ref. Range 02/04/2019 13:41  TRAB Latest Ref Range: <=2.00 IU/L 14.72 (H)    Thyroid uptake and scan 04/16/2019 24 hour I-123 uptake = 28.8% (normal 10-30%)     ASSESSMENT / PLAN / RECOMMENDATIONS:   1. Subclinical Graves' Disease:   - Clinically she is euthyroid - No local neck symptoms.  - Thyroid uptake and scan 03/2019 was normal but had elevated TRAb - Tolerating methimazole without side effects   Medications  Continue Methimazole 5 mg daily   2. Graves' Disease:  - Pt with evidence of right eye proptosis, stable.  - Up to date on eye exam     F/u in 6 months     Signed electronically by: Madeline Guise, MD  [facility]  [facility] [address] [place], [place] [address] Phone: [phone] FAX: [phone]      CC: Madeline Golden, [place] [address] Phone: [phone]  Fax: [phone]   Return to Endocrinology clinic as below: No future appointments.

## 2020-05-13 NOTE — Patient Instructions (Signed)
-   Continue methimazole 5 mg daily  - stop by the lab today

## 2020-05-16 MED ORDER — METHIMAZOLE 5 MG PO TABS: 5.0000 mg | ORAL_TABLET | Freq: Every day | ORAL | 3 refills | Status: DC

## 2020-07-26 ENCOUNTER — Other Ambulatory Visit: Payer: Self-pay

## 2020-07-26 ENCOUNTER — Ambulatory Visit
Admission: EM | Admit: 2020-07-26 | Discharge: 2020-07-26 | Disposition: A | Discharge status: Home / Self Care | Payer: 59 | Attending: Emergency Medicine | Admitting: Emergency Medicine

## 2020-07-26 DIAGNOSIS — Z1152 Encounter for screening for COVID-19: Secondary | ICD-10-CM

## 2020-07-26 NOTE — ED Triage Notes (Signed)
Pt here for covid testing post exposure last Tuesday; denies sx

## 2020-07-27 LAB — NOVEL CORONAVIRUS, NAA: SARS-CoV-2, NAA: NOT DETECTED

## 2020-07-27 LAB — SARS-COV-2, NAA 2 DAY TAT

## 2020-09-16 ENCOUNTER — Other Ambulatory Visit: Payer: Self-pay

## 2020-09-16 ENCOUNTER — Ambulatory Visit: Payer: 59 | Admitting: Internal Medicine

## 2020-09-16 ENCOUNTER — Encounter: Payer: Self-pay | Admitting: Internal Medicine

## 2020-09-16 VITALS — BP 124/84 | HR 82 | Ht 70.0 in | Wt 223.2 lb

## 2020-09-16 DIAGNOSIS — E0590 Thyrotoxicosis, unspecified without thyrotoxic crisis or storm: Secondary | ICD-10-CM

## 2020-09-16 DIAGNOSIS — E0500 Thyrotoxicosis with diffuse goiter without thyrotoxic crisis or storm: Secondary | ICD-10-CM | POA: Diagnosis not present

## 2020-09-16 LAB — TSH: TSH: 0.98 u[IU]/mL (ref 0.35–4.50)

## 2020-09-16 LAB — T4, FREE: Free T4: 0.8 ng/dL (ref 0.60–1.60)

## 2020-09-16 NOTE — Patient Instructions (Signed)
-   Please stop by the lab today  

## 2020-09-16 NOTE — Progress Notes (Signed)
Name: Madeline Golden  MRN/ DOB: [id number], 16-Sep-1976    Age/ Sex: 44 y.o., female     PCP: Madeline Bang, DO   Reason for Endocrinology Evaluation: hyperthyroidism     Initial Endocrinology Clinic Visit: 02/04/2019    PATIENT IDENTIFIER: Madeline Golden is a 44 y.o., female with a past medical history of Graves' Disease, Breat Ca (S/P mastectomy and sx) She has followed with [facility] since 02/04/2019 for consultative assistance with management of her hyperthyroidism.   HISTORICAL SUMMARY:  She was diagnosed with hyperthyroidism secondary to graves' disease in 2017, he was initially on methimazole for ~ 1.5 years without remission.  She was under the impression that she had RAI ablation in 2018 but this was a thyroid uptake and scan, gradually her TSH started going down and was restarted on methiamzole  but by her initial visit to our clinic she was off of methimazole again.   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   She stopped methimazole 4 weeks ago   She has intentional weight loss  She denies  palpitation , anxiety or diarrhea.   She has right eye proptosis but has been stable for the past years but would like to see an opthalmologic. Denies itching and burning  Denies local neck symptoms   She is S/P hystrectomy/oophrectomy  in 2015      HISTORY:  Past Medical History:  Past Medical History:  Diagnosis Date   DVT (deep venous thrombosis) ([place]) 2005   No known cause    Graves disease    Past Surgical History:  Past Surgical History:  Procedure Laterality Date   ABDOMINAL HYSTERECTOMY     APPENDECTOMY     double mastectomy  2012   hystrectomy with oophrectomy  2015    protective     Social History:  reports that she has never smoked. She has never used smokeless tobacco. She reports current alcohol use. She reports that she does not use drugs. Family History:  Family History  Problem Relation Age of Onset   Diabetes Mother    Breast cancer Mother    Diabetes Maternal Grandmother    Breast cancer Maternal Grandmother    Lung cancer Father    Bone cancer Father    Lung cancer Paternal Aunt      HOME MEDICATIONS: Allergies as of 09/16/2020   No Known Allergies     Medication List       Accurate as of September 16, 2020 12:50 PM. If you have any questions, ask your nurse or doctor.        cyclobenzaprine 5 MG tablet Commonly known as: FLEXERIL Take 1 tablet (5 mg total) by mouth 3 (three) times daily as needed for muscle spasms.   methimazole 5 MG tablet Commonly known as: TAPAZOLE Take 1 tablet (5 mg total) by mouth daily.   multivitamin tablet Take 1 tablet by mouth daily.   naproxen 500 MG tablet Commonly known as: NAPROSYN Take 1 tablet (500 mg total) by mouth 2 (two) times daily.   vitamin C 1000 MG tablet Take 1,000 mg by mouth daily.         OBJECTIVE:   PHYSICAL EXAM: VS: There were no vitals  taken for this visit.   EXAM: General: Pt appears well and is in NAD  Eyes: External eye exam shows right eye protosis with a stare, but no  lid lag Left eye exam normal   Neck: General: Supple without adenopathy. Thyroid: Thyroid size normal.  No goiter or nodules appreciated. No thyroid bruit.  Lungs: Clear with good BS bilat with no rales, rhonchi, or wheezes  Heart: Auscultation: RRR.  Abdomen: nontender  Extremities:  BL LE: No pretibial edema normal ROM and strength.  Mental Status: Judgment, insight: Intact Orientation: Oriented to time, place, and person Mood and affect: No depression, anxiety, or agitation     DATA REVIEWED:  Results for Madeline Golden (MRN [id number]) as of 09/19/2020 07:36  Ref.  Range 11/06/2019 13:36  TSH Latest Ref Range: 0.35 - 4.50 uIU/mL 0.19 (L)  T4,Free(Direct) Latest Ref Range: 0.60 - 1.60 ng/dL 0.89     Results for Madeline Golden (MRN [id number]) as of 11/09/2019 09:21  Ref. Range 02/04/2019 13:41  TRAB Latest Ref Range: <=2.00 IU/L 14.72 (H)    Thyroid uptake and scan 04/16/2019 24 hour I-123 uptake = 28.8% (normal 10-30%)     ASSESSMENT / PLAN / RECOMMENDATIONS:   1. Subclinical Graves' Disease:   - Clinically she is euthyroid - No local neck symptoms.  - Thyroid uptake and scan 03/2019 was normal but had elevated TRAb - Has been OFF methimazole for 2 months with repeat TFT's being normal, this is consistent with remission    Medications Stop Methimazole   2. Graves' Disease:  - Pt with evidence of right eye proptosis, stable. Would like to seek treatment  - Will refer to Dr. Sabino Dick     F/u in 6 months     Signed electronically by: Mack Guise, MD  [facility]  [facility] [address] [place],  [address] Phone: [phone] FAX: [phone]      CC: Madeline Golden, [place] [address] Phone: [phone]  Fax: [phone]   Return to Endocrinology clinic as below: Future Appointments  Date Time Provider [place]  09/16/2020  1:00 PM Marios Gaiser, Melanie Crazier, MD [id number] None

## 2020-11-03 ENCOUNTER — Ambulatory Visit
Admission: EM | Admit: 2020-11-03 | Discharge: 2020-11-03 | Disposition: A | Discharge status: Home / Self Care | Payer: 59 | Attending: Emergency Medicine | Admitting: Emergency Medicine

## 2020-11-03 ENCOUNTER — Other Ambulatory Visit: Payer: Self-pay

## 2020-11-03 DIAGNOSIS — R590 Localized enlarged lymph nodes: Secondary | ICD-10-CM

## 2020-11-03 MED ORDER — IBUPROFEN 600 MG PO TABS: 600.0000 mg | ORAL_TABLET | Freq: Four times a day (QID) | ORAL | 0 refills | Status: DC | PRN

## 2020-11-03 NOTE — Discharge Instructions (Signed)
Use anti-inflammatories for pain/swelling. You may take up to 800 mg Ibuprofen every 8 hours with food. You may supplement Ibuprofen with Tylenol [phone] mg every 8 hours.  Warm compresses Follow up with primary care, if persisting or enlarging

## 2020-11-03 NOTE — ED Provider Notes (Signed)
[name]    CSN: [id number] Arrival date & time: 11/03/20  0818      History   Chief Complaint Chief Complaint  Patient presents with  . Neck Pain    X 3 days    HPI Madeline Golden is a 44 y.o. female history of Graves' disease, prior DVT, presenting today for evaluation of neck pain.  Patient reports over the past 4 days she has had a lump and crick in her neck.  Noticed a knot recently.  Has associated tenderness with this.  Denies any injury or fall.  Denies associated URI symptoms sore throat or difficulty swallowing.  HPI  Past Medical History:  Diagnosis Date  . DVT (deep venous thrombosis) ([place]) 2005   No known cause   . Graves disease     Patient Active Problem List   Diagnosis Date Noted  . 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 Hyperthyroidism 02/04/2019  . Graves disease 02/04/2019  . Hx of breast cancer 02/04/2019    Past Surgical History:  Procedure Laterality Date  . ABDOMINAL HYSTERECTOMY    . APPENDECTOMY    . double mastectomy  2012  . hystrectomy with oophrectomy  2015   protective     OB History   No obstetric history on file.      Home Medications    Prior to Admission medications   Medication Sig Start Date End Date Taking? Authorizing Provider  Ascorbic Acid (VITAMIN C) 1000 MG tablet Take 1,000 mg by mouth daily.    [provider]  cyclobenzaprine (FLEXERIL) 5 MG tablet Take 1 tablet (5 mg total) by mouth 3 (three) times daily as needed for muscle spasms. 03/30/20   Nicolette Bang, DO  ibuprofen (ADVIL) 600 MG tablet Take 1 tablet (600 mg total) by mouth every 6 (six) hours as needed. 11/03/20   Ramar Nobrega C, PA-C  Multiple Vitamin (MULTIVITAMIN) tablet Take 1 tablet by mouth daily.    [provider]    Family History Family History  Problem Relation Age of Onset  . Diabetes Mother   . Breast cancer Mother   . Diabetes Maternal Grandmother   . Breast cancer Maternal Grandmother   .  Lung cancer Father   . Bone cancer Father   . Lung cancer Paternal Aunt     Social History Social History   Tobacco Use  . Smoking status: Never Smoker  . Smokeless tobacco: Never Used  Vaping Use  . Vaping Use: Never used  Substance Use Topics  . Alcohol use: Yes  . Drug use: Never     Allergies   Patient has no known allergies.   Review of Systems Review of Systems  Constitutional: Negative for fatigue and fever.  Eyes: Negative for visual disturbance.  Respiratory: Negative for shortness of breath.   Cardiovascular: Negative for chest pain.  Gastrointestinal: Negative for abdominal pain, nausea and vomiting.  Musculoskeletal: Positive for myalgias and neck pain. Negative for arthralgias and joint swelling.  Skin: Negative for color change, rash and wound.  Neurological: Negative for dizziness, weakness, light-headedness and headaches.     Physical Exam Triage Vital Signs ED Triage Vitals  Enc Vitals Group     BP 11/03/20 0834 111/77     Pulse Rate 11/03/20 0834 77     Resp 11/03/20 0834 17     Temp 11/03/20 0834 98.3 F (36.8 C)     Temp Source 11/03/20 0834 Oral     SpO2 11/03/20 0834 99 %  Weight --      Height --      Head Circumference --      Peak Flow --      Pain Score 11/03/20 0836 5     Pain Loc --      Pain Edu? --      Excl. in [place]? --    No data found.  Updated Vital Signs BP 111/77 (BP Location: Right Arm)   Pulse 77   Temp 98.3 F (36.8 C) (Oral)   Resp 17   SpO2 99%   Visual Acuity Right Eye Distance:   Left Eye Distance:   Bilateral Distance:    Right Eye Near:   Left Eye Near:    Bilateral Near:     Physical Exam Vitals and nursing note reviewed.  Constitutional:      Appearance: She is well-developed and well-nourished.     Comments: No acute distress  HENT:     Head: Normocephalic and atraumatic.     Ears:     Comments: Bilateral ears without tenderness to palpation of external auricle, tragus and mastoid,  EAC's without erythema or swelling, TM's with good bony landmarks and cone of light. Non erythematous.     Nose: Nose normal.     Mouth/Throat:     Comments: Oral mucosa pink and moist, no tonsillar enlargement or exudate. Posterior pharynx patent and nonerythematous, no uvula deviation or swelling. Normal phonation. Eyes:     Conjunctiva/sclera: Conjunctivae normal.  Neck:     Comments: Left anterior cervical lymphadenopathy present with some tenderness, no overlying erythema Cardiovascular:     Rate and Rhythm: Normal rate and regular rhythm.  Pulmonary:     Effort: Pulmonary effort is normal. No respiratory distress.     Comments: Breathing comfortably at rest, CTABL, no wheezing, rales or other adventitious sounds auscultated Abdominal:     General: There is no distension.  Musculoskeletal:        General: Normal range of motion.     Cervical back: Neck supple.  Lymphadenopathy:     Cervical: Cervical adenopathy present.  Skin:    General: Skin is warm and dry.  Neurological:     Mental Status: She is alert and oriented to person, place, and time.  Psychiatric:        Mood and Affect: Mood and affect normal.      UC Treatments / Results  Labs (all labs ordered are listed, but only abnormal results are displayed) Labs Reviewed - No data to display  EKG   Radiology No results found.  Procedures Procedures (including critical care time)  Medications Ordered in UC Medications - No data to display  Initial Impression / Assessment and Plan / UC Course  I have reviewed the triage vital signs and the nursing notes.  Pertinent labs & imaging results that were available during my care of the patient were reviewed by me and considered in my medical decision making (see chart for details).     Left cervical lymphadenopathy for approximately 4 days, no associated URI symptoms, exam unremarkable.  Recommending warm compresses anti-inflammatories with close monitoring.   Patient to follow-up with PCP if lymph node swelling persisting beyond 2 to 3 weeks or enlarging to have further evaluation with possible ultrasound/biopsy as needed.  Discussed strict return precautions. Patient verbalized understanding and is agreeable with plan.  Final Clinical Impressions(s) / UC Diagnoses   Final diagnoses:  Lymphadenopathy, anterior cervical     Discharge Instructions  Use anti-inflammatories for pain/swelling. You may take up to 800 mg Ibuprofen every 8 hours with food. You may supplement Ibuprofen with Tylenol [id number] mg every 8 hours.  Warm compresses Follow up with primary care, if persisting or enlarging    ED Prescriptions    Medication Sig Dispense Auth. Provider   ibuprofen (ADVIL) 600 MG tablet Take 1 tablet (600 mg total) by mouth every 6 (six) hours as needed. 30 tablet Jadarrius Maselli, [place] C, PA-C     PDMP not reviewed this encounter.   Janith Lima, [place] 11/03/20 [phone]

## 2020-11-03 NOTE — ED Triage Notes (Signed)
Patient states she has had a lump and a crick in her neck x 4 days. Due to past medical history, pt is somewhat concerned but feels she may have just slept wrong. Pt is aox4 and ambulatory.

## 2020-11-16 ENCOUNTER — Ambulatory Visit (HOSPITAL_BASED_OUTPATIENT_CLINIC_OR_DEPARTMENT_OTHER): Payer: 59 | Admitting: Genetic Counselor

## 2020-11-16 ENCOUNTER — Other Ambulatory Visit: Payer: Self-pay | Admitting: Genetic Counselor

## 2020-11-16 ENCOUNTER — Inpatient Hospital Stay: Payer: 59 | Attending: Genetic Counselor

## 2020-11-16 ENCOUNTER — Encounter: Payer: Self-pay | Admitting: Genetic Counselor

## 2020-11-16 DIAGNOSIS — Z853 Personal history of malignant neoplasm of breast: Secondary | ICD-10-CM

## 2020-11-16 DIAGNOSIS — Z803 Family history of malignant neoplasm of breast: Secondary | ICD-10-CM | POA: Diagnosis not present

## 2020-11-16 LAB — GENETIC SCREENING ORDER

## 2020-11-16 NOTE — Progress Notes (Signed)
REFERRING PROVIDER: Nicolette Bang, DO [place],  [place] [address]  PRIMARY PROVIDER:  Nicolette Bang, DO  PRIMARY REASON FOR VISIT:  1. Hx of breast cancer   2. Family history of breast cancer      HISTORY OF PRESENT ILLNESS:   Ms. Zuk, a 44 y.o. female, was seen for a [facility] cancer genetics consultation at the request of Dr. Juleen China due to a personal and family history of breast cancer.  Ms. Goetzke presents to clinic today to discuss the possibility of a hereditary predisposition to cancer, genetic testing, and to further clarify her future cancer risks, as well as potential cancer risks for family members.   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 The treatment plan included a bilateral mastectomy.  She reports having genetic testing at the time that was negative, but may have had an inconclusive result.   CANCER HISTORY:  Oncology History   No history exists.     RISK FACTORS:  Menarche was at age 79.  First live birth at age N/A.  Ovaries intact: no.  Hysterectomy: yes.  Menopausal status: postmenopausal.  HRT use: 0 years. Colonoscopy: no; not examined. Mammogram within the last year: no. Number of breast biopsies: 1. Up to date with pelvic exams: yes. Any excessive radiation exposure in the past: no  Past Medical History:  Diagnosis Date   DVT (deep venous thrombosis) ([place]) 2005   No known cause    Family history of breast cancer    Graves disease     Past Surgical History:  Procedure Laterality Date   ABDOMINAL HYSTERECTOMY     APPENDECTOMY     double mastectomy  2012   hystrectomy with oophrectomy  2015   protective     Social History   Socioeconomic History   Marital status: Married    Spouse name: Not on file   Number of children: Not on file   Years of education: Not on file   Highest education level: Not on file  Occupational History   Not on  file  Tobacco Use   Smoking status: Never Smoker   Smokeless tobacco: Never Used  Vaping Use   Vaping Use: Never used  Substance and Sexual Activity   Alcohol use: Yes   Drug use: Never   Sexual activity: Yes  Other Topics Concern   Not on file  Social History Narrative   Not on file   Social Determinants of Health   Financial Resource Strain: Not on file  Food Insecurity: Not on file  Transportation Needs: Not on file  Physical Activity: Not on file  Stress: Not on file  Social Connections: Not on file     FAMILY HISTORY:  We obtained a detailed, 4-generation family history.  Significant diagnoses are listed below: Family History  Problem Relation Age of Onset   Diabetes Mother    Breast cancer Mother 4       again at 31   Diabetes Maternal Grandmother    Breast cancer Maternal Grandmother        dx in her 38s   Lung cancer Father    Bone cancer Father        multiple myeloma   Lung cancer Paternal Aunt    Cancer Paternal Aunt        NOS   Breast cancer Maternal Aunt 73   Throat cancer Maternal Uncle    Breast  cancer Other        MGM's mother   Breast cancer Other        MGM's maternal aunt    The patient does not have biological children, but she is raising her nephew.  She has two sister and a brother who are cancer free.  Her mother is living and her father is deceased.  The patient's father had possible multiple myeloma.  He had many siblings, some who had cancer, but the specifics are not know.   The patient's mother had breast cancer at 69 and now again at 61. She has four sisters and six brothers.  One sister had breast cancer at 11.  Her mother had breast cancer over age 24.  This grandmother was an only child, but her mother and her mother's sister all had breast cancer.  Ms. Peplinski is aware of previous family history of genetic testing for hereditary cancer risks. Patient's maternal ancestors are of Caucasian and African American  descent, and paternal ancestors are of African American descent. There is no reported Ashkenazi Jewish ancestry. There is no known consanguinity.  GENETIC COUNSELING ASSESSMENT: Ms. Litaker is a 44 y.o. female with a personal and family history of breast cancer which is somewhat suggestive of a hereditary cancer syndrome and predisposition to cancer given the number of women in the family with breast cancer, some at young ages. We, therefore, discussed and recommended the following at today's visit.   DISCUSSION: We discussed that 5 - 10% of breast cancer is hereditary, with most cases associated with BRCA mutations.  The patient most likely was tested for BRCA mutations at the time of her diagnosis.  However, testing has improved over time in that the number of genes tested and the ability to identify mutations through NGS has increased the sensitivity of testing.  There are other genes that can be associated with hereditary breast cancer syndromes.  These include ATM, CHEK2 and PALB2.  We discussed that testing is beneficial for several reasons including knowing how to follow individuals after completing their treatment, identifying whether potential treatment options such as PARP inhibitors would be beneficial, and understand if other family members could be at risk for cancer and allow them to undergo genetic testing.   We reviewed the characteristics, features and inheritance patterns of hereditary cancer syndromes. We also discussed genetic testing, including the appropriate family members to test, the process of testing, insurance coverage and turn-around-time for results. We discussed the implications of a negative, positive, carrier and/or variant of uncertain significant result. We recommended Ms. Shanon Brow pursue genetic testing for the CancerNext-Expanded+RNAinsight gene panel. The CancerNext-Expanded gene panel offered by [facility] and includes sequencing and rearrangement analysis for the  following 77 genes: AIP, ALK, APC*, ATM*, AXIN2, BAP1, BARD1, BLM, BMPR1A, BRCA1*, BRCA2*, BRIP1*, CDC73, CDH1*, CDK4, CDKN1B, CDKN2A, CHEK2*, CTNNA1, DICER1, FANCC, FH, FLCN, GALNT12, KIF1B, LZTR1, MAX, MEN1, MET, MLH1*, MSH2*, MSH3, MSH6*, MUTYH*, NBN, NF1*, NF2, NTHL1, PALB2*, PHOX2B, PMS2*, POT1, PRKAR1A, PTCH1, PTEN*, RAD51C*, RAD51D*, RB1, RECQL, RET, SDHA, SDHAF2, SDHB, SDHC, SDHD, SMAD4, SMARCA4, SMARCB1, SMARCE1, STK11, SUFU, TMEM127, TP53*, TSC1, TSC2, VHL and XRCC2 (sequencing and deletion/duplication); EGFR, EGLN1, HOXB13, KIT, MITF, PDGFRA, POLD1, and POLE (sequencing only); EPCAM and GREM1 (deletion/duplication only). DNA and RNA analyses performed for * genes.   Based on Ms. Westra's personal and family history of cancer, she meets medical criteria for genetic testing. Despite that she meets criteria, she may still have an out of pocket cost. We discussed that  if her out of pocket cost for testing is over $100, the laboratory will call and confirm whether she wants to proceed with testing.  If the out of pocket cost of testing is less than $100 she will be billed by the genetic testing laboratory.   PLAN: After considering the risks, benefits, and limitations, Ms. Shanon Brow provided informed consent to pursue genetic testing and the blood sample was sent to [facility] for analysis of the CancerNext-Expanded+RNAinsight. Results should be available within approximately 2-3 weeks' time, at which point they will be disclosed by telephone to Ms. Stammer, as will any additional recommendations warranted by these results. Ms. Scarpino will receive a summary of her genetic counseling visit and a copy of her results once available. This information will also be available in Epic.   Lastly, we encouraged Ms. Inoue to remain in contact with cancer genetics annually so that we can continuously update the family history and inform her of any changes in cancer genetics and testing that may be of benefit for  this family.   Ms. Julian questions were answered to her satisfaction today. Our contact information was provided should additional questions or concerns arise. Thank you for the referral and allowing [place] to share in the care of your patient.   Leonell Lobdell P. Florene Glen, [place], [facility] Licensed, Insurance risk surveyor [email or web address] phone: [phone]  The patient was seen for a total of 30 minutes in face-to-face genetic counseling.  This patient was discussed with Drs. Magrinat, Lindi Adie and/or Burr Medico who agrees with the above.    _______________________________________________________________________ For Office Staff:  Number of people involved in session: 2 Was an Intern/ student involved with case: no

## 2020-11-23 DIAGNOSIS — E0500 Thyrotoxicosis with diffuse goiter without thyrotoxic crisis or storm: Secondary | ICD-10-CM | POA: Insufficient documentation

## 2020-11-30 ENCOUNTER — Telehealth: Payer: Self-pay | Admitting: Genetic Counselor

## 2020-11-30 ENCOUNTER — Ambulatory Visit: Payer: Self-pay | Admitting: Genetic Counselor

## 2020-11-30 DIAGNOSIS — IMO0001 Reserved for inherently not codable concepts without codable children: Secondary | ICD-10-CM | POA: Insufficient documentation

## 2020-11-30 DIAGNOSIS — Z1379 Encounter for other screening for genetic and chromosomal anomalies: Secondary | ICD-10-CM

## 2020-11-30 NOTE — Telephone Encounter (Signed)
Revealed negative genetic testing.  Discussed that we do not know why she has breast cancer so young or why there is cancer in the family. It could be due to a different gene that we are not testing, or maybe our current technology may not be able to pick something up.  It will be important for her to keep in contact with genetics to keep up with whether additional testing may be needed.  One VUS in [place].  This will not change medical management.

## 2020-11-30 NOTE — Progress Notes (Signed)
HPI:  Ms. Barsch was previously seen in the [place] clinic due to a personal and family history of breast cancer and concerns regarding a hereditary predisposition to cancer. Please refer to our prior cancer genetics clinic note for more information regarding our discussion, assessment and recommendations, at the time. Ms. Naron recent genetic test results were disclosed to her, as were recommendations warranted by these results. These results and recommendations are discussed in more detail below.  CANCER HISTORY:  Oncology History   No history exists.    FAMILY HISTORY:  We obtained a detailed, 4-generation family history.  Significant diagnoses are listed below: Family History  Problem Relation Age of Onset  . Diabetes Mother   . Breast cancer Mother 69       again at 48  . Diabetes Maternal Grandmother   . Breast cancer Maternal Grandmother        dx in her 32s  . Lung cancer Father   . Bone cancer Father        multiple myeloma  . Lung cancer Paternal Aunt   . Cancer Paternal Aunt        NOS  . Breast cancer Maternal Aunt 73  . Throat cancer Maternal Uncle   . Breast cancer Other        MGM's mother  . Breast cancer Other        MGM's maternal aunt    The patient does not have biological children, but she is raising her nephew.  She has two sister and a brother who are cancer free.  Her mother is living and her father is deceased.  The patient's father had possible multiple myeloma.  He had many siblings, some who had cancer, but the specifics are not know.   The patient's mother had breast cancer at 34 and now again at 72. She has four sisters and six brothers.  One sister had breast cancer at 34.  Her mother had breast cancer over age 74.  This grandmother was an only child, but her mother and her mother's sister all had breast cancer.  Ms. Trim is aware of previous family history of genetic testing for hereditary cancer risks. Patient's maternal  ancestors are of Caucasian and African American descent, and paternal ancestors are of African American descent. There is no reported Ashkenazi Jewish ancestry. There is no known consanguinity.  GENETIC TEST RESULTS: Genetic testing reported out on November 28, 2020 through the CancerNext-Expanded+RNAinsight cancer panel found no pathogenic mutations. The CancerNext-Expanded gene panel offered by [facility] and includes sequencing and rearrangement analysis for the following 77 genes: AIP, ALK, APC*, ATM*, AXIN2, BAP1, BARD1, BLM, BMPR1A, BRCA1*, BRCA2*, BRIP1*, CDC73, CDH1*, CDK4, CDKN1B, CDKN2A, CHEK2*, CTNNA1, DICER1, FANCC, FH, FLCN, GALNT12, KIF1B, LZTR1, MAX, MEN1, MET, MLH1*, MSH2*, MSH3, MSH6*, MUTYH*, NBN, NF1*, NF2, NTHL1, PALB2*, PHOX2B, PMS2*, POT1, PRKAR1A, PTCH1, PTEN*, RAD51C*, RAD51D*, RB1, RECQL, RET, SDHA, SDHAF2, SDHB, SDHC, SDHD, SMAD4, SMARCA4, SMARCB1, SMARCE1, STK11, SUFU, TMEM127, TP53*, TSC1, TSC2, VHL and XRCC2 (sequencing and deletion/duplication); EGFR, EGLN1, HOXB13, KIT, MITF, PDGFRA, POLD1, and POLE (sequencing only); EPCAM and GREM1 (deletion/duplication only). DNA and RNA analyses performed for * genes. The test report has been scanned into EPIC and is located under the Molecular Pathology section of the Results Review tab.  A portion of the result report is included below for reference.     We discussed with Ms. Jerry that because current genetic testing is not perfect, it is possible there may be  a gene mutation in one of these genes that current testing cannot detect, but that chance is small.  We also discussed, that there could be another gene that has not yet been discovered, or that we have not yet tested, that is responsible for the cancer diagnoses in the family. It is also possible there is a hereditary cause for the cancer in the family that Ms. Danko did not inherit and therefore was not identified in her testing.  Therefore, it is important to remain in touch  with cancer genetics in the future so that we can continue to offer Ms. Chalker the most up to date genetic testing.   Genetic testing did identify a variant of uncertain significance (VUS) was identified in the [facility] gene called [id number].  At this time, it is unknown if this variant is associated with increased cancer risk or if this is a normal finding, but most variants such as this get reclassified to being inconsequential. It should not be used to make medical management decisions. With time, we suspect the lab will determine the significance of this variant, if any. If we do learn more about it, we will try to contact Ms. Caywood to discuss it further. However, it is important to stay in touch with [place] periodically and keep the address and phone number up to date.  ADDITIONAL GENETIC TESTING: We discussed with Ms. Bhandari that her genetic testing was fairly extensive.  If there are genes identified to increase cancer risk that can be analyzed in the future, we would be happy to discuss and coordinate this testing at that time.    CANCER SCREENING RECOMMENDATIONS: Ms. Paiva test result is considered negative (normal).  This means that we have not identified a hereditary cause for her personal and family history of breast cancer at this time. Most cancers happen by chance and this negative test suggests that her cancer may fall into this category.    While reassuring, this does not definitively rule out a hereditary predisposition to cancer. It is still possible that there could be genetic mutations that are undetectable by current technology. There could be genetic mutations in genes that have not been tested or identified to increase cancer risk.  Therefore, it is recommended she continue to follow the cancer management and screening guidelines provided by her oncology and primary healthcare provider.   An individual's cancer risk and medical management are not determined by genetic test results alone.  Overall cancer risk assessment incorporates additional factors, including personal medical history, family history, and any available genetic information that may result in a personalized plan for cancer prevention and surveillance  RECOMMENDATIONS FOR FAMILY MEMBERS:  Individuals in this family might be at some increased risk of developing cancer, over the general population risk, simply due to the family history of cancer.  We recommended women in this family have a yearly mammogram beginning at age 32, or 22 years younger than the earliest onset of cancer, an annual clinical breast exam, and perform monthly breast self-exams. Women in this family should also have a gynecological exam as recommended by their primary provider. All family members should be referred for colonoscopy starting at age 10.  FOLLOW-UP: Lastly, we discussed with Ms. Linehan that cancer genetics is a rapidly advancing field and it is possible that new genetic tests will be appropriate for her and/or her family members in the future. We encouraged her to remain in contact with cancer genetics on an annual basis so we can  update her personal and family histories and let her know of advances in cancer genetics that may benefit this family.   Our contact number was provided. Ms. Delair questions were answered to her satisfaction, and she knows she is welcome to call us at anytime with additional questions or concerns.   Roma Kayser, [place], [facility] Licensed, Certified Genetic Counselor [email or web address]

## 2021-02-17 HISTORY — PX: OTHER SURGICAL HISTORY: SHX169

## 2021-03-07 ENCOUNTER — Ambulatory Visit: Payer: 59 | Attending: Critical Care Medicine

## 2021-03-07 DIAGNOSIS — Z20822 Contact with and (suspected) exposure to covid-19: Secondary | ICD-10-CM

## 2021-03-08 LAB — SARS-COV-2, NAA 2 DAY TAT

## 2021-03-08 LAB — NOVEL CORONAVIRUS, NAA: SARS-CoV-2, NAA: NOT DETECTED

## 2021-03-10 DIAGNOSIS — J3489 Other specified disorders of nose and nasal sinuses: Secondary | ICD-10-CM | POA: Insufficient documentation

## 2021-03-24 ENCOUNTER — Ambulatory Visit: Payer: 59 | Admitting: Internal Medicine

## 2021-03-24 ENCOUNTER — Encounter: Payer: Self-pay | Admitting: Internal Medicine

## 2021-03-24 ENCOUNTER — Other Ambulatory Visit: Payer: Self-pay

## 2021-03-24 VITALS — BP 118/72 | HR 91 | Ht 70.0 in | Wt 219.2 lb

## 2021-03-24 DIAGNOSIS — E0590 Thyrotoxicosis, unspecified without thyrotoxic crisis or storm: Secondary | ICD-10-CM | POA: Diagnosis not present

## 2021-03-24 DIAGNOSIS — E0500 Thyrotoxicosis with diffuse goiter without thyrotoxic crisis or storm: Secondary | ICD-10-CM

## 2021-03-24 LAB — TSH: TSH: 0.64 u[IU]/mL (ref 0.35–4.50)

## 2021-03-24 LAB — T4, FREE: Free T4: 0.91 ng/dL (ref 0.60–1.60)

## 2021-03-24 NOTE — Progress Notes (Signed)
Name: Madeline Golden  MRN/ DOB: [id number], [birth year]    Age/ Sex: 45 y.o., female     PCP: Madeline Bang, DO   Reason for Endocrinology Evaluation: hyperthyroidism     Initial Endocrinology Clinic Visit: 02/04/2019    PATIENT IDENTIFIER: Madeline Golden is a 45 y.o., female with a past medical history of Graves' Disease, Breat Ca (S/P mastectomy and sx) She has followed with [facility] since 02/04/2019 for consultative assistance with management of her hyperthyroidism.   HISTORICAL SUMMARY:  She was diagnosed with hyperthyroidism secondary to graves' disease in 2017, he was initially on methimazole for ~ 1.5 years without remission.  She was under the impression that she had RAI ablation in 2018 but this was a thyroid uptake and scan, gradually her TSH started going down and was restarted on methiamzole  but by her initial visit to our clinic she was off of methimazole again.   Due to normal FT4 with a suppressed TSH, we proceed with thyroid uptake and scan on 04/16/2019 with normal uptake with a 24-hr of 28.8%  Methimazole started 10/2019  Was off methimazole by 07/2020  SUBJECTIVE:    Today (03/24/2021):  [name] is here for a 6 month follow up on hyperthyroidism secondary to graves' disease.   She has been noted with weight loss  Denies diarrhea  Denies tremors or palpitations Denies local neck symptoms     She is S/P right orbital decompression with Dr. Sabino Dick   She is S/P hystrectomy/oophrectomy  in 2015      HISTORY:  Past Medical History:  Past Medical History:  Diagnosis Date  . DVT (deep venous thrombosis) ([place]) 2005   No known cause   . Family history of breast cancer   . Graves disease    Past Surgical History:  Past Surgical History:  Procedure Laterality Date  . ABDOMINAL HYSTERECTOMY    . APPENDECTOMY    . double mastectomy  2012  . hystrectomy with oophrectomy  2015   protective     Social History:  reports  that she has never smoked. She has never used smokeless tobacco. She reports current alcohol use. She reports that she does not use drugs. Family History:  Family History  Problem Relation Age of Onset  . Diabetes Mother   . Breast cancer Mother 69       again at 78  . Diabetes Maternal Grandmother   . Breast cancer Maternal Grandmother        dx in her 77s  . Lung cancer Father   . Bone cancer Father        multiple myeloma  . Lung cancer Paternal Aunt   . Cancer Paternal Aunt        NOS  . Breast cancer Maternal Aunt 73  . Throat cancer Maternal Uncle   . Breast cancer Other        MGM's mother  . Breast cancer Other        MGM's maternal aunt     HOME MEDICATIONS: Allergies as of 03/24/2021   No Known Allergies     Medication List       Accurate as of Mar 24, 2021 11:38 AM. If you have any questions, ask your nurse or doctor.        cyclobenzaprine 5 MG tablet Commonly known as: FLEXERIL Take 1 tablet (5 mg total) by mouth 3 (three) times daily as needed for muscle spasms.   ibuprofen 600 MG  tablet Commonly known as: ADVIL Take 1 tablet (600 mg total) by mouth every 6 (six) hours as needed.   multivitamin tablet Take 1 tablet by mouth daily.   vitamin C 1000 MG tablet Take 1,000 mg by mouth daily.         OBJECTIVE:   PHYSICAL EXAM: VS: BP 118/72   Pulse 91   Ht 5' 10"  (1.778 m)   Wt 219 lb 4 oz (99.5 kg)   SpO2 98%   BMI 31.46 kg/m    EXAM: General: Pt appears well and is in NAD  Neck: General: Supple without adenopathy. Thyroid: Thyroid size normal.  No goiter or nodules appreciated.  Lungs: Clear with good BS bilat with no rales, rhonchi, or wheezes  Heart: Auscultation: RRR.  Abdomen: nontender  Extremities:  BL LE: No pretibial edema normal ROM and strength.  Mental Status: Judgment, insight: Intact Orientation: Oriented to time, place, and person Mood and affect: No depression, anxiety, or agitation     DATA REVIEWED: Results  for ALMAROSA, BOHAC (MRN [id number]) as of 03/27/2021 07:57  Ref. Range 03/24/2021 15:24  TSH Latest Ref Range: 0.35 - 4.50 uIU/mL 0.64  Triiodothyronine (T3) Latest Ref Range: 76 - 181 ng/dL 75 (L)  T4,Free(Direct) Latest Ref Range: 0.60 - 1.60 ng/dL 0.91      Results for ALEZA, PEW (MRN [id number]) as of 11/09/2019 09:21  Ref. Range 02/04/2019 13:41  TRAB Latest Ref Range: <=2.00 IU/L 14.72 (H)    Thyroid uptake and scan 04/16/2019 24 hour I-123 uptake = 28.8% (normal 10-30%)     ASSESSMENT / PLAN / RECOMMENDATIONS:   1. Subclinical Graves' Disease:   - In remission. Has been off Methimazole since 07/2020 - No local neck symptoms.  - Thyroid uptake and scan 03/2019 was normal but had elevated TRAb - TSH and FT4 are normal, total t3 is low , this is most likely due to low thyroxine binding globulin and not clinically significant   2. Graves' Disease:  -She is status post right orbital decompression surgery with Dr. Sabino Dick     Patient to have annual TFTs through her PCP, patient will return to our office should her TFTs become abnormal    Signed electronically by: Mack Guise, MD  [facility] Endocrinology  [address] [place], [place] [address] Phone: [phone] FAX: [phone]      CC: Madeline Golden, [place] [address] Phone: [phone]  Fax: [phone]   Return to Endocrinology clinic as below: Future Appointments  Date Time Provider [place]  03/24/2021  3:00 PM Matai Carpenito, Melanie Crazier, MD [id number] None

## 2021-03-25 LAB — T3: T3, Total: 75 ng/dL — ABNORMAL LOW (ref 76–181)

## 2021-05-13 NOTE — Progress Notes (Signed)
Patient ID: Madeline Golden, female    DOB: Jul 09, 1976  MRN: [id number]  CC: Left Leg and Left Foot Swelling  Subjective: Madeline Golden is a 45 y.o. female who presents for left leg and left foot swelling.    Her concerns today include:   BACK PAIN: 03/30/2020 per DO note: Will obtain imaging given chronicity and history of breast cancer. Will refer to orthopedics for further management.  - DG Thoracic Spine 2 View; Future - Ambulatory referral to Orthopedic Surgery - cyclobenzaprine (FLEXERIL) 5 MG tablet; Take 1 tablet (5 mg total) by mouth 3 (three) times daily as needed for muscle spasms.  Dispense: 30 tablet; Refill: 1  Visit 04/04/2020 at [facility] per MD note: Plan:  Back pain- spurring in lateral thoracic spine on x-ray from 5/12, but pain on exam appears localized to upper lumbar paraspinal muscles, worse on the left side with trigger points appreciated. Will refer to [facility] PT for myofascial work and exercises. If pain does not improve, will obtain MRI as she has history of breast cancer and family history of bone marrow cancer.  05/15/2021: Right-sided back pain worsening. Would like to know what the cause may be and if spurs are worsening. She did follow-up with PT. Reports she discontinued PT services related to financial concerns of $55 per visit as she was having 2 visits weekly. Reports she felt the PT visits were mostly related to losing weight and exercising. She has considered possible weight loss surgery in the past. She is interested in weight management classes. She is not interested in medication management as of present.    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     ibuprofen (ADVIL) 600 MG tablet Take 1 tablet (600 mg total) by mouth every 6 (six) hours as needed. 30 tablet 0   Multiple Vitamin (MULTIVITAMIN) tablet Take 1 tablet by mouth daily.     No current facility-administered medications on file prior to visit.    No Known Allergies  Social History   Socioeconomic History   Marital status: Married    Spouse name: Not on file   Number of children: Not on file   Years of education: Not on file   Highest education level: Not on file  Occupational History   Not on file  Tobacco Use   Smoking status: Never   Smokeless tobacco: Never  Vaping Use   Vaping Use: Never used  Substance and Sexual Activity   Alcohol use: Yes   Drug use: Never   Sexual activity: Yes  Other Topics Concern   Not on file  Social History Narrative   Not on file   Social Determinants of Health   Financial Resource Strain: Not on file  Food Insecurity: Not on file  Transportation Needs: Not on file  Physical Activity: Not on file  Stress: Not on file  Social Connections: Not on file  Intimate Partner Violence: Not on file    Family History  Problem Relation Age of Onset   Diabetes Mother    Breast cancer Mother 25  again at 60   Diabetes Maternal Grandmother    Breast cancer Maternal Grandmother        dx in her 57s   Lung cancer Father    Bone cancer Father        multiple myeloma   Lung cancer Paternal Aunt    Cancer Paternal Aunt        NOS   Breast cancer Maternal Aunt 73   Throat cancer Maternal Uncle    Breast cancer Other        MGM's mother   Breast cancer Other        MGM's maternal aunt    Past Surgical History:  Procedure Laterality Date   ABDOMINAL HYSTERECTOMY     APPENDECTOMY     double mastectomy  2012   hystrectomy with oophrectomy  2015   protective    orbital decompression Right 02/2021    ROS: Review of Systems Negative  except as stated above  PHYSICAL EXAM: BP 114/82 (BP Location: Left Arm, Patient Position: Sitting, Cuff Size: Normal)   Pulse 83   Temp 98.1 F (36.7 C)   Resp 15   Ht _0  (1.778 m)   Wt 220 lb (99.8 kg)   SpO2 98%   BMI 31.57 kg/m   Physical Exam General appearance - alert, well appearing, and in no distress and oriented to person, place, and time Neck - supple, no significant adenopathy Chest - clear to auscultation, no wheezes, rales or rhonchi, symmetric air entry, no tachypnea, retractions or cyanosis Heart - normal rate, regular rhythm, normal S1, S2, no murmurs, rubs, clicks or gallops Back exam - tenderness noted right mid-lower back Neurological - alert, oriented, normal speech, no focal findings or movement disorder noted  ASSESSMENT AND PLAN: 1. Chronic right-sided thoracic back pain: - Patient reports back pain worsening.  - Increase Cyclobenzaprine from 5 mg three times daily to 10 mg three times daily.  - Patient was followed by [facility]. Will refer back for further evaluation and management.  - cyclobenzaprine (FLEXERIL) 10 MG tablet; Take 1 tablet (10 mg total) by mouth 3 (three) times daily as needed for muscle spasms.  Dispense: 30 tablet; Refill: 0 - Ambulatory referral to Orthopedic Surgery  2. Class 1 obesity without serious comorbidity with body mass index (BMI) of 31.0 to 31.9 in adult, unspecified obesity type: - Patient declined pharmacological management.  - Referral to Medical Weight Management for further evaluation and management.  - Amb Ref to Medical Weight Management    Patient was given the opportunity to ask questions.  Patient verbalized understanding of the plan and was able to repeat key elements of the plan. Patient was given clear instructions to go to Emergency Department or return to medical center if symptoms don't improve, worsen, or new problems develop.The patient verbalized understanding.   Orders Placed This  Encounter  Procedures   Ambulatory referral to Orthopedic Surgery   Amb Ref to Medical Weight Management     Requested Prescriptions   Signed Prescriptions Disp Refills   cyclobenzaprine (FLEXERIL) 10 MG tablet 30 tablet 0    Sig: Take 1 tablet (10 mg total) by mouth 3 (three) times daily as needed for muscle spasms.    Follow-up with primary provider as scheduled.   Camillia Herter, NP

## 2021-05-15 ENCOUNTER — Ambulatory Visit (INDEPENDENT_AMBULATORY_CARE_PROVIDER_SITE_OTHER): Payer: 59 | Admitting: Family

## 2021-05-15 ENCOUNTER — Other Ambulatory Visit: Payer: Self-pay

## 2021-05-15 ENCOUNTER — Encounter: Payer: Self-pay | Admitting: Family

## 2021-05-15 VITALS — BP 114/82 | HR 83 | Temp 98.1°F | Resp 15 | Ht 70.0 in | Wt 220.0 lb

## 2021-05-15 DIAGNOSIS — M546 Pain in thoracic spine: Secondary | ICD-10-CM | POA: Diagnosis not present

## 2021-05-15 DIAGNOSIS — E669 Obesity, unspecified: Secondary | ICD-10-CM | POA: Diagnosis not present

## 2021-05-15 DIAGNOSIS — Z6831 Body mass index (BMI) 31.0-31.9, adult: Secondary | ICD-10-CM | POA: Diagnosis not present

## 2021-05-15 DIAGNOSIS — G8929 Other chronic pain: Secondary | ICD-10-CM

## 2021-05-15 MED ORDER — CYCLOBENZAPRINE HCL 10 MG PO TABS: 10.0000 mg | ORAL_TABLET | Freq: Three times a day (TID) | ORAL | 0 refills | Status: AC | PRN

## 2021-05-15 NOTE — Progress Notes (Signed)
Pt presents today for right lower back pain states it has been going on for awhile has not found anything to alleviate pain  Pt states when initially scheduled appt her legs/feet were swollen but stated she had just came from [place] where she did a lot of walking because next day swelling was gone  Request refill on Flexiril but feels dosage needs to be increased

## 2021-05-15 NOTE — Patient Instructions (Signed)
Chronic Back Pain When back pain lasts longer than 3 months, it is called chronic back pain. Pain may get worse at certain times (flare-ups). There are things you can do at home to manage your pain. Follow these instructions at home: Pay attention to any changes in your symptoms. Take these actions to help with your pain: Managing pain and stiffness   If told, put ice on the painful area. Your doctor may tell you to use ice for 24-48 hours after the flare-up starts. To do this: Put ice in a plastic bag. Place a towel between your skin and the bag. Leave the ice on for 20 minutes, 2-3 times a day. If told, put heat on the painful area. Do this as often as told by your doctor. Use the heat source that your doctor recommends, such as a moist heat pack or a heating pad. Place a towel between your skin and the heat source. Leave the heat on for 20-30 minutes. Take off the heat if your skin turns bright red. This is especially important if you are unable to feel pain, heat, or cold. You may have a greater risk of getting burned. Soak in a warm bath. This can help relieve pain. Activity  Avoid bending and other activities that make pain worse. When standing: Keep your upper back and neck straight. Keep your shoulders pulled back. Avoid slouching. When sitting: Keep your back straight. Relax your shoulders. Do not round your shoulders or pull them backward. Do not sit or stand in one place for long periods of time. Take short rest breaks during the day. Lying down or standing is usually better than sitting. Resting can help relieve pain. When sitting or lying down for a long time, do some mild activity or stretching. This will help to prevent stiffness and pain. Get regular exercise. Ask your doctor what activities are safe for you. Do not lift anything that is heavier than 10 lb (4.5 kg) or the limit that you are told, until your doctor says that it is safe. To prevent injury when you lift  things: Bend your knees. Keep the weight close to your body. Avoid twisting. Sleep on a firm mattress. Try lying on your side with your knees slightly bent. If you lie on your back, put a pillow under your knees. Medicines Treatment may include medicines for pain and swelling taken by mouth or put on the skin, prescription pain medicine, or muscle relaxants. Take over-the-counter and prescription medicines only as told by your doctor. Ask your doctor if the medicine prescribed to you: Requires you to avoid driving or using machinery. Can cause trouble pooping (constipation). You may need to take these actions to prevent or treat trouble pooping: Drink enough fluid to keep your pee (urine) pale yellow. Take over-the-counter or prescription medicines. Eat foods that are high in fiber. These include beans, whole grains, and fresh fruits and vegetables. Limit foods that are high in fat and sugars. These include fried or sweet foods. General instructions Do not use any products that contain nicotine or tobacco, such as cigarettes, e-cigarettes, and chewing tobacco. If you need help quitting, ask your doctor. Keep all follow-up visits as told by your doctor. This is important. Contact a doctor if: Your pain does not get better with rest or medicine. Your pain gets worse, or you have new pain. You have a high fever. You lose weight very quickly. You have trouble doing your normal activities. Get help right away if: One   or both of your legs or feet feel weak. One or both of your legs or feet lose feeling (have numbness). You have trouble controlling when you poop (have a bowel movement) or pee (urinate). You have bad back pain and: You feel like you may vomit (nauseous), or you vomit. You have pain in your belly (abdomen). You have shortness of breath. You faint. Summary When back pain lasts longer than 3 months, it is called chronic back pain. Pain may get worse at certain times  (flare-ups). Use ice and heat as told by your doctor. Your doctor may tell you to use ice after flare-ups. This information is not intended to replace advice given to you by your health care provider. Make sure you discuss any questions you have with your health care provider. Document Revised: 12/16/2019 Document Reviewed: 12/16/2019 Elsevier Patient Education  2022 [employer]  

## 2021-05-17 ENCOUNTER — Encounter: Payer: Self-pay | Admitting: Family Medicine

## 2021-05-17 ENCOUNTER — Other Ambulatory Visit: Payer: Self-pay

## 2021-05-17 ENCOUNTER — Ambulatory Visit (INDEPENDENT_AMBULATORY_CARE_PROVIDER_SITE_OTHER): Payer: 59 | Admitting: Family Medicine

## 2021-05-17 ENCOUNTER — Encounter: Payer: Self-pay | Admitting: Family

## 2021-05-17 VITALS — Ht 67.0 in | Wt 220.0 lb

## 2021-05-17 DIAGNOSIS — M546 Pain in thoracic spine: Secondary | ICD-10-CM

## 2021-05-17 NOTE — Progress Notes (Signed)
Office Visit Note   Patient: Madeline Golden           Date of Birth: 19-Oct-1976           MRN: [id number] Visit Date: 05/17/2021 Requested by: Camillia Herter, NP [place],  [place] [address] PCP: Nicolette Bang, MD  Subjective: Chief Complaint  Patient presents with   Middle Back - Pain    Patient has complaint of right middle to low back pain x years. She feels that this is getting worse since she was seen here last in May 2021. She was referred to PT at that time which she states helped some. She is using over the counter medications and received new prescription of Flexeril 56m yesterday from PCP.     HPI: She is here for follow-up chronic right-sided lower thoracic pain.  Since last visit, she went to physical therapy.  She went multiple times and they treated her with dry needling and then gave her home exercises.  She was getting temporary relief, but not complete relief.  Pain is constant but worse when in certain positions such as lying on her side.  No radicular symptoms, no fevers or chills.  She takes Flexeril as needed with some improvement.              ROS:   All other systems were reviewed and are negative.  Objective: Vital Signs: Ht 5' 7" (1.702 m)   Wt 220 lb (99.8 kg)   BMI 34.46 kg/m   Physical Exam:  General:  Alert and oriented, in no acute distress. Pulm:  Breathing unlabored. Psy:  Normal mood, congruent affect.  Back: She has tenderness in the right lower posterior rib area, and upper part of the quadratus lumborum.  She has negative straight leg raise, no pain with internal hip rotation.  Lower extremity strength and reflexes are normal.    Imaging: No results found.  Assessment & Plan: Chronic right lower thoracic pain, etiology uncertain. -MRI to rule out disc protrusion with nerve impingement.  If negative, we will continue focusing on myofascial pain treatment.     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   Hyperthyroidism 02/04/2019   Graves disease 02/04/2019   Hx of breast cancer 02/04/2019   Past Medical History:  Diagnosis Date   DVT (deep venous thrombosis) ([place] 2005   No known cause    Family history of breast cancer    Graves disease     Family History  Problem Relation Age of Onset   Diabetes Mother    Breast cancer Mother 434      again at 668  Diabetes Maternal Grandmother    Breast cancer Maternal Grandmother        dx in her 669s  Lung cancer Father    Bone cancer Father        multiple myeloma   Lung cancer Paternal Aunt    Cancer Paternal Aunt        NOS   Breast cancer Maternal Aunt 73   Throat cancer Maternal Uncle    Breast cancer Other        MGM's mother   Breast cancer Other        MGM's maternal aunt  Past Surgical History:  Procedure Laterality Date   ABDOMINAL HYSTERECTOMY     APPENDECTOMY     double mastectomy  2012   hystrectomy with oophrectomy  2015   protective    orbital decompression Right 02/2021   Social History   Occupational History   Not on file  Tobacco Use   Smoking status: Never   Smokeless tobacco: Never  Vaping Use   Vaping Use: Never used  Substance and Sexual Activity   Alcohol use: Yes   Drug use: Never   Sexual activity: Yes

## 2021-05-25 NOTE — Progress Notes (Signed)
Virtual Visit via Telephone Note  I connected with Madeline Golden, on 05/26/2021 at 7:48 AM by telephone due to the COVID-19 pandemic and verified that I am speaking with the correct person using two identifiers.  Due to current restrictions/limitations of in-office visits due to the COVID-19 pandemic, this scheduled clinical appointment was converted to a telehealth visit.   Consent: I discussed the limitations, risks, security and privacy concerns of performing an evaluation and management service by telephone and the availability of in person appointments. I also discussed with the patient that there may be a patient responsible charge related to this service. The patient expressed understanding and agreed to proceed.   Location of Patient: Home  Location of Provider: [facility] [place] participating in Telemedicine visit: Rich Number, NP Elmon Else, CMA  History of Present Illness: Madeline Golden is a 45 year-old female who presents for weight management.   WEIGHT MANAGEMENT: 05/15/2021: - Patient declined pharmacological management. - Referral to Medical Weight Management for further evaluation and management.   05/26/2021: Reports concerns that in the past she was seeing a nutritionist. Started Vitamin B12 injections and Phentermine around the same time. Subsequently developed breast cancer. Concern that the Vitamin B12 or Phentermine caused breast cancer to develop.   Since last visit with me she had discussion with her sister who is an Therapist, sports about weight management medications. Reports her sister is having success with injectable medications and she would like to try as well. Patient denies personal or family history of thyroid cancer.  Diet Adherence:  Reports doing ok during the nighttime. Nighttime tends to be the hours she is hungry and eats more. Exercising?  Water aerobics 3x weekly  Previous attempts at weight loss:  yes Peak weight: 240 pounds  Weight goal: 170 pounds  Requesting obesity pharmacotherapy:  yes, would like to try injectable   Past Medical History:  Diagnosis Date   DVT (deep venous thrombosis) ([place]) 2005   No known cause    Family history of breast cancer    Graves disease    Allergies  Allergen Reactions   Tegaderm Ag Mesh [Silver] Rash    Current Outpatient Medications on File Prior to Visit  Medication Sig Dispense Refill   Ascorbic Acid (VITAMIN C) 1000 MG tablet Take 1,000 mg by mouth daily.     cyclobenzaprine (FLEXERIL) 10 MG tablet Take 1 tablet (10 mg total) by mouth 3 (three) times daily as needed for muscle spasms. 30 tablet 0   ibuprofen (ADVIL) 600 MG tablet Take 1 tablet (600 mg total) by mouth every 6 (six) hours as needed. 30 tablet 0   Multiple Vitamin (MULTIVITAMIN) tablet Take 1 tablet by mouth daily.     No current facility-administered medications on file prior to visit.    Observations/Objective: Alert and oriented x 3. Not in acute distress. Physical examination not completed as this is a telemedicine visit.  Assessment and Plan: 1. Encounter for weight management: 2. Class 1 obesity without serious comorbidity with body mass index (BMI) of 31.0 to 31.9 in adult, unspecified obesity type: - Begin Semaglutide as prescribed.  - Patient has history of Graves' Disease. Not taking Methimazole since September 2021. Most recent visit with Endocrinology on 03/24/2021. Thyroid panel normal at that time with recommendation to have annual thyroid panel at PCP and to return to Endocrinology should thyroid panel becomes abnormal. - Counseled regarding the potential risk for medullary thyroid carcinoma with the use  of Semaglutide. Particular symptoms of thyroid tumors to monitor for include a mass in the neck, dysphagia, dyspnea, and/or persistent hoarseness. Patient does not have personal history or family history of thyroid cancers. Patient verbalized understanding. -  Counseled on low-sodium, DASH diet and 150 minutes of moderate intensity exercise per week as tolerated. Discussed medication compliance, adverse effects. - Patient would like a visit with my CMA for demonstration of correct administration of injections. Will have the office to call patient to schedule this soon. - Follow-up with primary provider in 4 weeks or sooner if needed.  - Semaglutide,0.25 or 0.5MG /DOS, (OZEMPIC, 0.25 OR 0.5 MG/DOSE,) 2 MG/1.5ML SOPN; Inject 0.25 mg into the skin once a week.  Dispense: 0.75 mL; Refill: 0 - Insulin Pen Needle (PEN NEEDLES) 31G X 8 MM MISC; UAD  Dispense: 100 each; Refill: 0  Follow Up Instructions: Follow-up with primary provider in 4 weeks or sooner if needed.    Patient was given clear instructions to go to Emergency Department or return to medical center if symptoms don't improve, worsen, or new problems develop.The patient verbalized understanding.  I discussed the assessment and treatment plan with the patient. The patient was provided an opportunity to ask questions and all were answered. The patient agreed with the plan and demonstrated an understanding of the instructions.   The patient was advised to call back or seek an in-person evaluation if the symptoms worsen or if the condition fails to improve as anticipated.   I provided 15 minutes total of non-face-to-face time during this encounter.   Camillia Herter, NP  [facility] [place], [place] 05/26/2021, 7:48 AM

## 2021-05-26 ENCOUNTER — Other Ambulatory Visit: Payer: Self-pay

## 2021-05-26 ENCOUNTER — Telehealth: Payer: Self-pay | Admitting: Internal Medicine

## 2021-05-26 ENCOUNTER — Telehealth (INDEPENDENT_AMBULATORY_CARE_PROVIDER_SITE_OTHER): Payer: 59 | Admitting: Family

## 2021-05-26 DIAGNOSIS — E669 Obesity, unspecified: Secondary | ICD-10-CM | POA: Diagnosis not present

## 2021-05-26 DIAGNOSIS — Z7689 Persons encountering health services in other specified circumstances: Secondary | ICD-10-CM | POA: Diagnosis not present

## 2021-05-26 DIAGNOSIS — Z6831 Body mass index (BMI) 31.0-31.9, adult: Secondary | ICD-10-CM

## 2021-05-26 MED ORDER — OZEMPIC (0.25 OR 0.5 MG/DOSE) 2 MG/1.5ML ~~LOC~~ SOPN: 0.2500 mg | PEN_INJECTOR | SUBCUTANEOUS | 0 refills | Status: AC

## 2021-05-26 MED ORDER — PEN NEEDLES 31G X 8 MM MISC: 0 refills | Status: DC

## 2021-05-26 NOTE — Telephone Encounter (Signed)
PT came into clinic requesting approval for authorization for Semaglutide,0.25 or 0.5MG /DOS, (OZEMPIC, 0.25 OR 0.5 MG/DOSE,) 2 MG/1.5ML SOPN [[id number]]  .    Pt states [facility] told her she cannot get medication yet and pt is leaving for out of town and wanted to get this medication before. Please advise and thank you

## 2021-05-26 NOTE — Telephone Encounter (Signed)
Prior authorization started for Ozempic 0.25 or 0.5mg - Key#[id number] -APPROVED

## 2021-06-01 ENCOUNTER — Encounter: Payer: Self-pay | Admitting: Family

## 2021-06-04 ENCOUNTER — Encounter: Payer: Self-pay | Admitting: Family Medicine

## 2021-06-05 ENCOUNTER — Telehealth: Payer: Self-pay | Admitting: Family Medicine

## 2021-06-05 MED ORDER — DIAZEPAM 5 MG PO TABS: ORAL_TABLET | ORAL | 0 refills | Status: DC

## 2021-06-05 NOTE — Telephone Encounter (Signed)
Pt called stating she is having an MRI on 06/06/21 and she's claustrophobic; so she would like to have something called in. Pt would like a CB to be updated when Dr. Junius Roads sends something in.   [phone]

## 2021-06-05 NOTE — Telephone Encounter (Signed)
I called and advised the patient the medication has been sent in to [facility].

## 2021-06-06 ENCOUNTER — Ambulatory Visit
Admission: RE | Admit: 2021-06-06 | Discharge: 2021-06-06 | Disposition: A | Discharge status: Home / Self Care | Payer: 59 | Source: Ambulatory Visit | Attending: Family Medicine | Admitting: Family Medicine

## 2021-06-06 ENCOUNTER — Other Ambulatory Visit: Payer: Self-pay

## 2021-06-06 ENCOUNTER — Telehealth: Payer: Self-pay | Admitting: Family Medicine

## 2021-06-06 DIAGNOSIS — M546 Pain in thoracic spine: Secondary | ICD-10-CM

## 2021-06-06 NOTE — Telephone Encounter (Signed)
The good news is that the thoracic MRI scan is normal.  The bad news is that I still do not know exactly what is causing your pain.

## 2021-07-03 NOTE — Progress Notes (Signed)
Patient ID: Madeline Golden, female    DOB: [birth year]  MRN: [id number]  CC: Weight Management Follow-Up  Subjective: Madeline Golden is a 45 y.o. female who presents for weight management follow-up.   Her concerns today include:   WEIGHT MANAGEMENT FOLLOW-UP: 05/26/2021: - Begin Semaglutide as prescribed.   07/05/2021: Doing well on current regimen. No side effects. No issues/concerns. Exercising consistently and eating healthier. Has noticed a decrease in appetite. Experiencing some constipation also has history of IBS with constipation. Taking over-the-counter Senna is helping usually having at least one bowel movement most days.   2. PALPITATIONS: Happened twice during nighttime awakening from sleep. Unsure if may be related to dreaming. Denies shortness of breath and chest pain.    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   External hemorrhoids 07/05/2021   Excessive or frequent menstruation 07/05/2021   Essential hypertension 07/05/2021   Dysmenorrhea 07/05/2021   Constipation 07/05/2021   Alopecia areata 07/05/2021   Low back pain 07/05/2021   Lumbosacral ligament sprain 07/05/2021   Lump or mass in breast 07/05/2021   Malignant neoplasm of breast (female) ([place]) 07/05/2021   Other ill-defined and unknown causes of morbidity and mortality 07/05/2021   Other acne 07/05/2021   Other disorders of menstruation and other abnormal bleeding from female genital tract 07/05/2021   Pain in thoracic spine 07/05/2021   Prediabetes 07/05/2021   Obesity 07/05/2021   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     cyclobenzaprine (FLEXERIL) 10 MG tablet cyclobenzaprine 10 mg tablet  TAKE 1 TABLET BY MOUTH THREE TIMES DAILY AS NEEDED FOR MUSCLE SPASM     diazepam (VALIUM) 5 MG tablet 1 PO 1 hour before MRI, repeat prn 5 tablet 0   ibuprofen (ADVIL) 600 MG tablet Take 1 tablet (600 mg total) by mouth every 6 (six) hours as needed. 30 tablet 0   Multiple Vitamin (MULTIVITAMIN) tablet Take 1 tablet by mouth daily.     No current facility-administered medications on file prior to visit.    Allergies  Allergen Reactions   Tegaderm Ag Mesh [Silver] Rash    Social History   Socioeconomic History   Marital status: Married    Spouse name: Not on file   Number of children: Not on file   Years of education: Not on file   Highest education level: Not on file  Occupational History   Not on file  Tobacco Use   Smoking status: Never   Smokeless tobacco: Never  Vaping Use   Vaping Use: Never used  Substance and Sexual Activity   Alcohol use: Yes   Drug use: Never   Sexual activity: Yes  Other Topics Concern   Not on file  Social History Narrative   Not on file   Social Determinants of Health   Financial Resource Strain: Not on file  Food Insecurity: Not on file  Transportation Needs: Not on  file  Physical Activity: Not on file  Stress: Not on file  Social Connections: Not on file  Intimate Partner Violence: Not on file    Family History  Problem Relation Age of Onset   Diabetes Mother    Breast cancer Mother 21       again at 30   Diabetes Maternal Grandmother    Breast cancer Maternal Grandmother        dx in her 19s   Lung cancer Father    Bone cancer Father        multiple myeloma   Lung cancer Paternal Aunt    Cancer Paternal Aunt        NOS   Breast cancer Maternal Aunt 73   Throat  cancer Maternal Uncle    Breast cancer Other        MGM's mother   Breast cancer Other        MGM's maternal aunt    Past Surgical History:  Procedure Laterality Date   ABDOMINAL HYSTERECTOMY     APPENDECTOMY     double mastectomy  2012   hystrectomy with oophrectomy  2015   protective    orbital decompression Right 02/2021    ROS: Review of Systems Negative except as stated above  PHYSICAL EXAM: BP 108/79 (BP Location: Left Arm, Patient Position: Sitting, Cuff Size: Large)   Pulse 88   Temp 97.9 F (36.6 C)   Resp 18   Ht 5' 10"  (1.778 m)   Wt 216 lb (98 kg)   SpO2 99%   BMI 30.99 kg/m   Physical Exam HENT:     Head: Normocephalic and atraumatic.  Eyes:     Extraocular Movements: Extraocular movements intact.     Conjunctiva/sclera: Conjunctivae normal.     Pupils: Pupils are equal, round, and reactive to light.  Cardiovascular:     Rate and Rhythm: Normal rate and regular rhythm.     Pulses: Normal pulses.     Heart sounds: Normal heart sounds.  Pulmonary:     Effort: Pulmonary effort is normal.     Breath sounds: Normal breath sounds.  Musculoskeletal:     Cervical back: Normal range of motion and neck supple.  Neurological:     General: No focal deficit present.     Mental Status: She is alert and oriented to person, place, and time.  Psychiatric:        Mood and Affect: Mood normal.        Behavior: Behavior normal.     ASSESSMENT AND PLAN: 1. Encounter for weight management: 2. Class 1 obesity without serious comorbidity with body mass index (BMI) of 31.0 to 31.9 in adult, unspecified obesity type: - Patient doing well on current regimen. Since last visit she has made healthy lifestyle modifications including diet and exercise. She has decreased weight by 4 pounds since last visit. Encouraged to continue doing a great job.  - Increase Semaglutide from 0.25 mg weekly to 0.5 mg weekly.  Counseled on low-sodium, DASH diet and 150 minutes of moderate  intensity exercise per week as tolerated.  - Follow-up with primary provider in 4 weeks or sooner if needed.  - Semaglutide-Weight Management 0.5 MG/0.5ML SOAJ; Inject 0.5 mg into the skin once a week.  Dispense: 3 mL; Refill: 0 - Insulin Pen Needle (PEN NEEDLES) 31G X 8 MM MISC; UAD  Dispense: 100 each; Refill: 0  3. Palpitations: - Occurring infrequently. Patient reports happened twice. Denies any red flag symptoms.  -  Patient encouraged to closely monitor and notify provider immediately should symptoms become more frequent or new symptoms develop. Patient verbalized understanding.  - Follow-up with primary provider as scheduled.    Patient was given the opportunity to ask questions.  Patient verbalized understanding of the plan and was able to repeat key elements of the plan. Patient was given clear instructions to go to Emergency Department or return to medical center if symptoms don't improve, worsen, or new problems develop.The patient verbalized understanding.   Requested Prescriptions   Signed Prescriptions Disp Refills   Semaglutide-Weight Management 0.5 MG/0.5ML SOAJ 3 mL 0    Sig: Inject 0.5 mg into the skin once a week.   Insulin Pen Needle (PEN NEEDLES) 31G X 8 MM MISC 100 each 0    Sig: UAD    Return in about 4 weeks (around 08/02/2021) for Follow-Up or next available weight management .  Camillia Herter, NP

## 2021-07-05 ENCOUNTER — Ambulatory Visit: Payer: 59 | Admitting: Family

## 2021-07-05 ENCOUNTER — Encounter: Payer: Self-pay | Admitting: Family

## 2021-07-05 ENCOUNTER — Other Ambulatory Visit: Payer: Self-pay

## 2021-07-05 VITALS — BP 108/79 | HR 88 | Temp 97.9°F | Resp 18 | Ht 70.0 in | Wt 216.0 lb

## 2021-07-05 DIAGNOSIS — R519 Headache, unspecified: Secondary | ICD-10-CM | POA: Insufficient documentation

## 2021-07-05 DIAGNOSIS — Z6831 Body mass index (BMI) 31.0-31.9, adult: Secondary | ICD-10-CM

## 2021-07-05 DIAGNOSIS — S339XXA Sprain of unspecified parts of lumbar spine and pelvis, initial encounter: Secondary | ICD-10-CM | POA: Insufficient documentation

## 2021-07-05 DIAGNOSIS — N946 Dysmenorrhea, unspecified: Secondary | ICD-10-CM | POA: Insufficient documentation

## 2021-07-05 DIAGNOSIS — M545 Low back pain, unspecified: Secondary | ICD-10-CM | POA: Insufficient documentation

## 2021-07-05 DIAGNOSIS — Z7689 Persons encountering health services in other specified circumstances: Secondary | ICD-10-CM | POA: Diagnosis not present

## 2021-07-05 DIAGNOSIS — L708 Other acne: Secondary | ICD-10-CM | POA: Insufficient documentation

## 2021-07-05 DIAGNOSIS — L639 Alopecia areata, unspecified: Secondary | ICD-10-CM | POA: Insufficient documentation

## 2021-07-05 DIAGNOSIS — C50919 Malignant neoplasm of unspecified site of unspecified female breast: Secondary | ICD-10-CM | POA: Insufficient documentation

## 2021-07-05 DIAGNOSIS — R7303 Prediabetes: Secondary | ICD-10-CM | POA: Insufficient documentation

## 2021-07-05 DIAGNOSIS — N920 Excessive and frequent menstruation with regular cycle: Secondary | ICD-10-CM | POA: Insufficient documentation

## 2021-07-05 DIAGNOSIS — R69 Illness, unspecified: Secondary | ICD-10-CM | POA: Insufficient documentation

## 2021-07-05 DIAGNOSIS — I10 Essential (primary) hypertension: Secondary | ICD-10-CM | POA: Insufficient documentation

## 2021-07-05 DIAGNOSIS — E66811 Body mass index (BMI) 31.0-31.9, adult: Secondary | ICD-10-CM

## 2021-07-05 DIAGNOSIS — K644 Residual hemorrhoidal skin tags: Secondary | ICD-10-CM | POA: Insufficient documentation

## 2021-07-05 DIAGNOSIS — E669 Obesity, unspecified: Secondary | ICD-10-CM | POA: Insufficient documentation

## 2021-07-05 DIAGNOSIS — N925 Other specified irregular menstruation: Secondary | ICD-10-CM | POA: Insufficient documentation

## 2021-07-05 DIAGNOSIS — R002 Palpitations: Secondary | ICD-10-CM | POA: Diagnosis not present

## 2021-07-05 DIAGNOSIS — N938 Other specified abnormal uterine and vaginal bleeding: Secondary | ICD-10-CM | POA: Insufficient documentation

## 2021-07-05 DIAGNOSIS — M546 Pain in thoracic spine: Secondary | ICD-10-CM | POA: Insufficient documentation

## 2021-07-05 DIAGNOSIS — D259 Leiomyoma of uterus, unspecified: Secondary | ICD-10-CM | POA: Insufficient documentation

## 2021-07-05 DIAGNOSIS — N971 Female infertility of tubal origin: Secondary | ICD-10-CM | POA: Insufficient documentation

## 2021-07-05 DIAGNOSIS — N63 Unspecified lump in unspecified breast: Secondary | ICD-10-CM | POA: Insufficient documentation

## 2021-07-05 DIAGNOSIS — K5900 Constipation, unspecified: Secondary | ICD-10-CM | POA: Insufficient documentation

## 2021-07-05 MED ORDER — SEMAGLUTIDE-WEIGHT MANAGEMENT 0.5 MG/0.5ML ~~LOC~~ SOAJ: 0.5000 mg | SUBCUTANEOUS | 0 refills | Status: DC

## 2021-07-05 MED ORDER — PEN NEEDLES 31G X 8 MM MISC: 0 refills | Status: DC

## 2021-07-05 NOTE — Progress Notes (Signed)
Pt presents for weight managament follow up, pt reports palpatations while she is sleep that awakes her

## 2021-07-17 ENCOUNTER — Telehealth: Payer: Self-pay | Admitting: Family

## 2021-07-17 NOTE — Telephone Encounter (Signed)
Semaglutide-Weight Management 0.5 MG/0.5ML SOAJ [id number] pt states went to pharmacy was unable to pick up med due to insurance denial claiming medication wasn't the same  as previous and un able to fill pt last seen 07-05-21 please assist it does say pharmacy comfrimed but pt went on 8-23 -22 and unable to get med

## 2021-07-18 NOTE — Telephone Encounter (Signed)
Pt came in 07/18/21 stated the pharmacy said the provider can substitute  Semaglutide-Weight Management 0.5 MG/0.5ML SOAJ For Ozempic per pt and she states dont have any more last week was her last dose

## 2021-07-20 ENCOUNTER — Encounter: Payer: Self-pay | Admitting: Family

## 2021-07-21 IMAGING — MR MR THORACIC SPINE W/O CM
6 of 8 series · 28 of 48 positions shown · non-contrast
Comparison: Thoracic spine x-rays dated March 30, 2020.

CLINICAL DATA: Chronic, progressively worsening upper back pain.

EXAM:
MRI THORACIC SPINE WITHOUT CONTRAST
TECHNIQUE: Multiplanar, multisequence MR imaging of the thoracic spine was
performed. No intravenous contrast was administered.

[Series 17: T1 · sagittal · 3.0mm · 1.25mm/px · 4 of 21 slices shown]
[im 1/21]
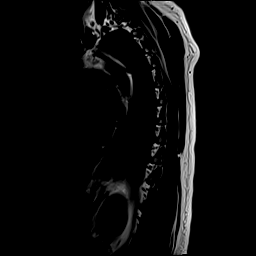
[im 7/21]
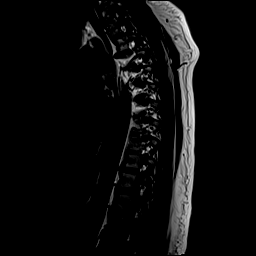
[im 14/21]
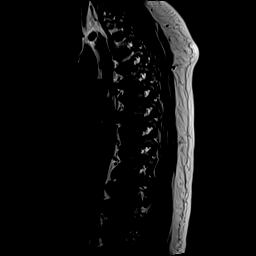
[im 21/21]
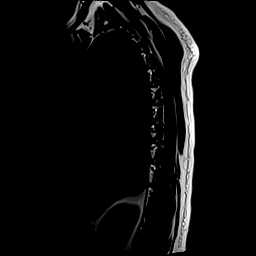

[Series 18: STIR · sagittal · 3.0mm · 1.00mm/px · 4 of 21 slices shown]
[im 1/21]
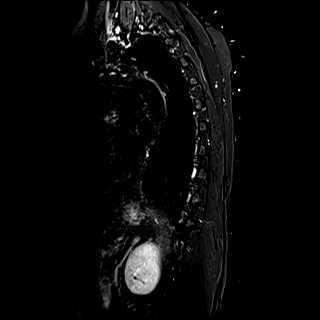
[im 7/21]
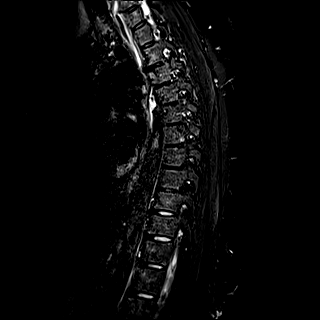
[im 14/21]
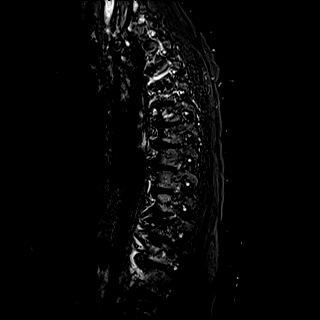
[im 21/21]
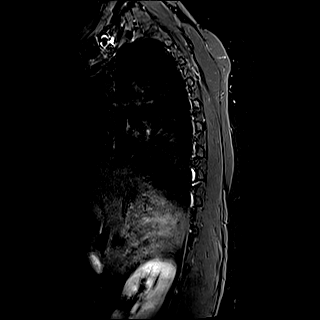

[Series 19: t1_tse_sag count · sagittal · 3.0mm · 1.56mm/px · 4 of 22 slices shown]
[im 1/22]
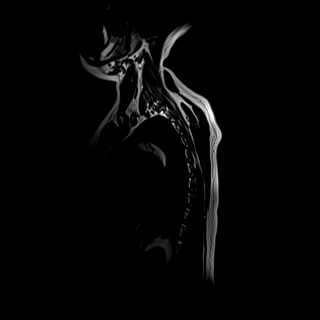
[im 8/22]
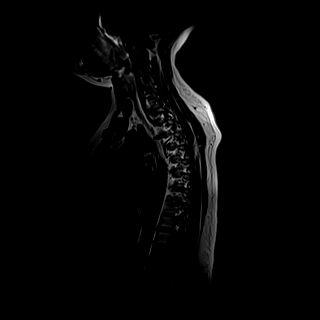
[im 15/22]
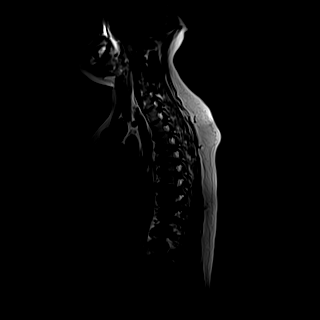
[im 22/22]
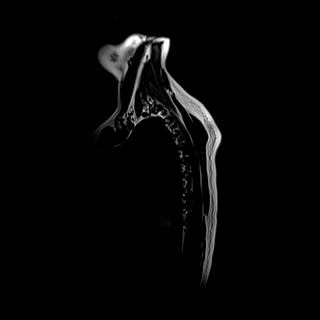

[Series 20: T2 · sagittal · 3.0mm · 0.83mm/px · 4 of 21 slices shown (1 of 2)]
[im 1/21]
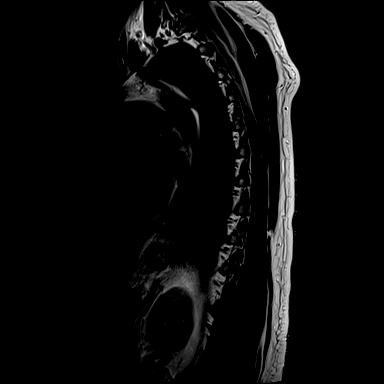
[im 7/21]
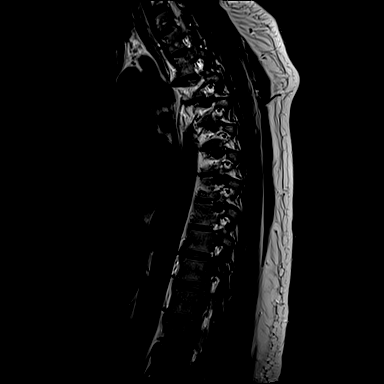
[im 14/21]
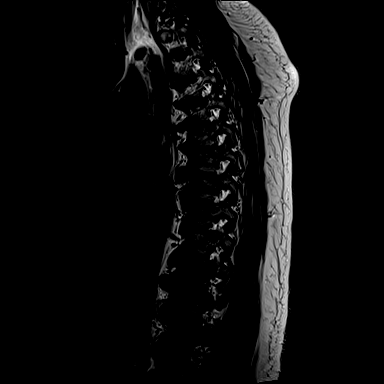
[im 21/21]
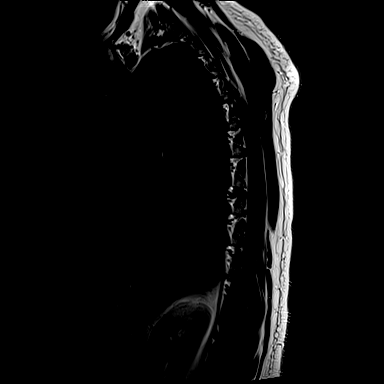

[Series 21: T2 · axial · 4.0mm · 0.35mm/px · z∈[-440,-225]mm · 6 of 30 slices shown (2 of 2)]
[im 1/30]
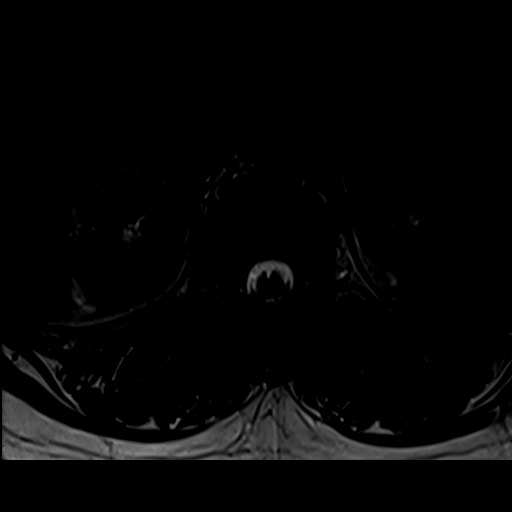
[im 6/30]
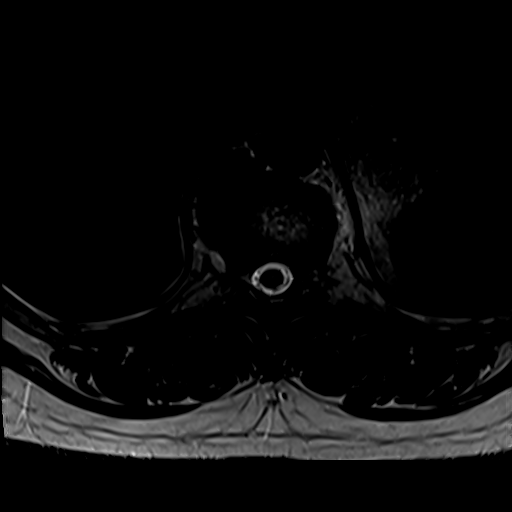
[im 12/30]
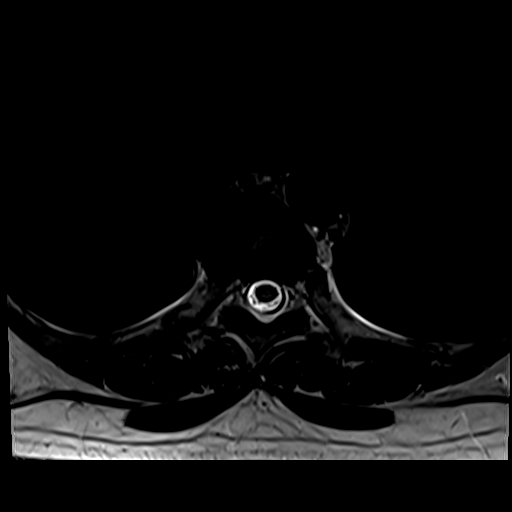
[im 18/30]
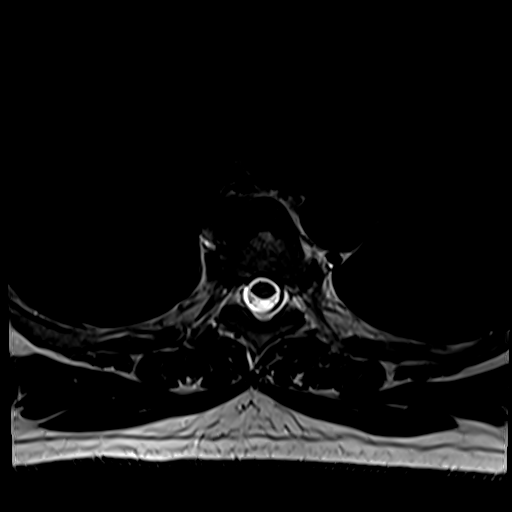
[im 24/30]
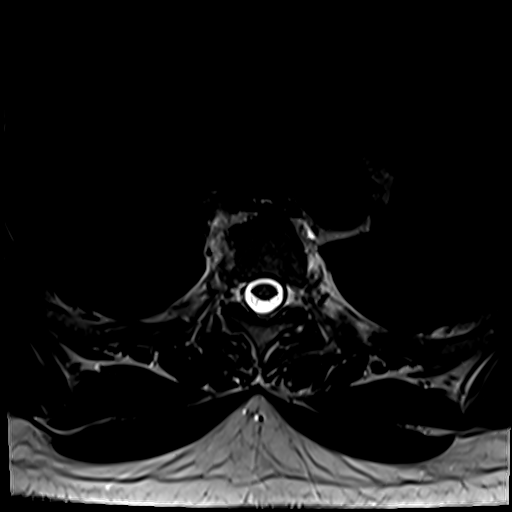
[im 30/30]
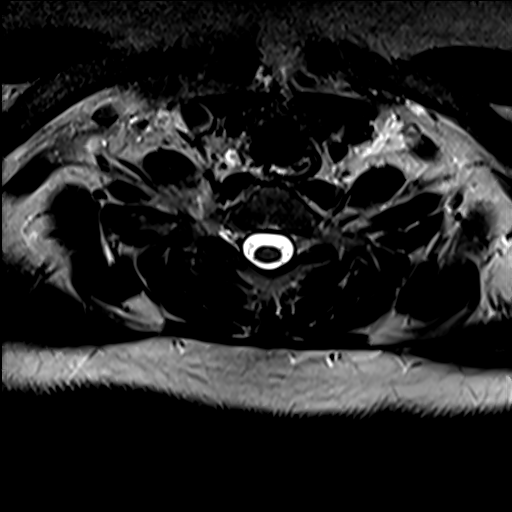

[Series 22: t2_me2d_tra · axial · 4.0mm · 0.70mm/px · z∈[-415,-223]mm · 6 of 33 slices shown]
[im 1/33]
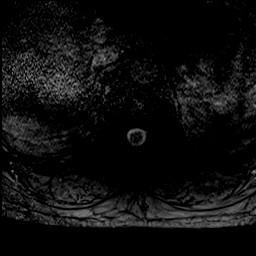
[im 7/33]
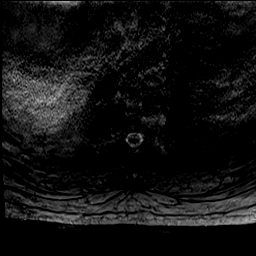
[im 13/33]
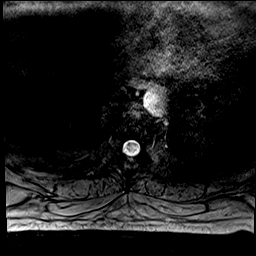
[im 20/33]
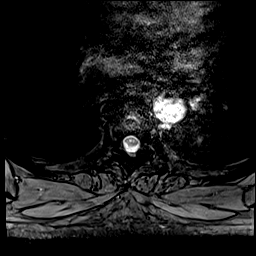
[im 26/33]
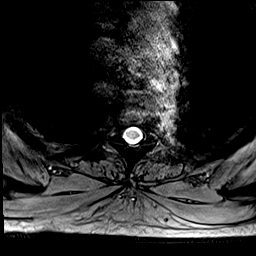
[im 33/33]
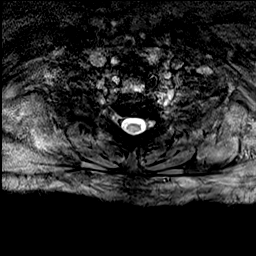

[28 of 48 positions shown; findings below may reference images not displayed]

FINDINGS: Alignment:  Physiologic.

Vertebrae: No fracture, evidence of discitis, or bone lesion.

Cord:  Normal signal and morphology.

Paraspinal and other soft tissues: Negative.

Disc levels:

Multilevel bulky right anterolateral endplate osteophytes. Disc
heights and hydration are preserved. No significant disc bulge or
herniation. No spinal canal or neuroforaminal stenosis.
IMPRESSION: 1. No acute abnormality or significant degenerative changes of the
thoracic spine.

## 2021-07-27 ENCOUNTER — Other Ambulatory Visit: Payer: Self-pay | Admitting: Family

## 2021-07-27 DIAGNOSIS — E669 Obesity, unspecified: Secondary | ICD-10-CM

## 2021-07-27 DIAGNOSIS — Z7689 Persons encountering health services in other specified circumstances: Secondary | ICD-10-CM

## 2021-07-27 DIAGNOSIS — Z6831 Body mass index (BMI) 31.0-31.9, adult: Secondary | ICD-10-CM

## 2021-08-02 DIAGNOSIS — Z0289 Encounter for other administrative examinations: Secondary | ICD-10-CM

## 2021-08-11 ENCOUNTER — Telehealth: Payer: 59 | Admitting: Family

## 2021-08-15 ENCOUNTER — Ambulatory Visit (INDEPENDENT_AMBULATORY_CARE_PROVIDER_SITE_OTHER): Payer: 59 | Admitting: Family Medicine

## 2021-08-15 ENCOUNTER — Other Ambulatory Visit: Payer: Self-pay

## 2021-08-15 ENCOUNTER — Encounter (INDEPENDENT_AMBULATORY_CARE_PROVIDER_SITE_OTHER): Payer: Self-pay | Admitting: Family Medicine

## 2021-08-15 VITALS — BP 125/84 | HR 79 | Temp 98.4°F | Ht 68.0 in | Wt 219.0 lb

## 2021-08-15 DIAGNOSIS — Z9189 Other specified personal risk factors, not elsewhere classified: Secondary | ICD-10-CM | POA: Diagnosis not present

## 2021-08-15 DIAGNOSIS — E559 Vitamin D deficiency, unspecified: Secondary | ICD-10-CM

## 2021-08-15 DIAGNOSIS — Z6833 Body mass index (BMI) 33.0-33.9, adult: Secondary | ICD-10-CM

## 2021-08-15 DIAGNOSIS — R0602 Shortness of breath: Secondary | ICD-10-CM | POA: Diagnosis not present

## 2021-08-15 DIAGNOSIS — E538 Deficiency of other specified B group vitamins: Secondary | ICD-10-CM | POA: Diagnosis not present

## 2021-08-15 DIAGNOSIS — R5383 Other fatigue: Secondary | ICD-10-CM | POA: Diagnosis not present

## 2021-08-15 DIAGNOSIS — E669 Obesity, unspecified: Secondary | ICD-10-CM

## 2021-08-15 DIAGNOSIS — Z1331 Encounter for screening for depression: Secondary | ICD-10-CM

## 2021-08-15 DIAGNOSIS — E0590 Thyrotoxicosis, unspecified without thyrotoxic crisis or storm: Secondary | ICD-10-CM

## 2021-08-15 NOTE — Progress Notes (Signed)
Chief Complaint:   OBESITY Madeline Golden (MR# [id number]) is a 45 y.o. female who presents for evaluation and treatment of obesity and related comorbidities. Current BMI is Body mass index is 33.3 kg/m. Madeline Golden has been struggling with her weight for many years and has been unsuccessful in either losing weight, maintaining weight loss, or reaching her healthy weight goal.  Madeline Golden was on a trial of Ozempic by her primary care provider recently, and she felt she did well. There is a shortage and she has to discontinue.  Madeline Golden is currently in the action stage of change and ready to dedicate time achieving and maintaining a healthier weight. Madeline Golden is interested in becoming our patient and working on intensive lifestyle modifications including (but not limited to) diet and exercise for weight loss.  Madeline Golden's habits were reviewed today and are as follows: Her family eats meals together, she struggles with family and or coworkers weight loss sabotage, her desired weight loss is 49 lbs, she has been heavy most of her life, she started gaining weight in her 33's, her heaviest weight ever was 235 pounds, she has significant food cravings issues, she snacks frequently in the evenings, she wakes up frequently in the middle of the night to eat, she skips meals frequently, she is frequently drinking liquids with calories, she frequently makes poor food choices, she has problems with excessive hunger, she frequently eats larger portions than normal, and she struggles with emotional eating.  Depression Screen Madeline Golden's Food and Mood (modified PHQ-9) score was 17.  Depression screen Madeline Golden 2/9 08/15/2021  Decreased Interest 3  Down, Depressed, Hopeless 2  PHQ - 2 Score 5  Altered sleeping 2  Tired, decreased energy 3  Change in appetite 3  Feeling bad or failure about yourself  3  Trouble concentrating 1  Moving slowly or fidgety/restless 0  Suicidal thoughts 0  PHQ-9 Score 17  Difficult doing  work/chores Very difficult   Subjective:   1. Other fatigue Madeline Golden admits to daytime somnolence and admits to waking up still tired. Patent has a history of symptoms of daytime fatigue, morning fatigue, and morning headache. Madeline Golden generally gets 4 or 5 hours of sleep per night, and states that she has nightime awakenings. Snoring is present. Apneic episodes are present. Epworth Sleepiness Score is 16.  2. SOB (shortness of breath) on exertion Madeline Golden notes increasing shortness of breath with exercising and seems to be worsening over time with weight gain. She notes getting out of breath sooner with activity than she used to. This has not gotten worse recently. Madeline Golden denies shortness of breath at rest or orthopnea.  3. Vitamin D deficiency Madeline Golden has a history of Vit D deficiency, and she notes fatigue.  4. B12 deficiency Madeline Golden is on B12, and she is due for labs and she notes fatigue.  5. Hyperthyroidism Madeline Golden was told that her thyroid level was elevated. She has not had surgery or radioactive iodine, and she is not on medications now. Her recent labs were within normal limits. She is followed by Endocrinology. She notes fatigue.  6. At risk for heart disease Madeline Golden is at a higher than average risk for cardiovascular disease due to obesity.   Assessment/Plan:   1. Other fatigue Madeline Golden does feel that her weight is causing her energy to be lower than it should be. Fatigue may be related to obesity, depression or many other causes. Labs will be ordered, and in the meanwhile, Madeline Golden will focus on self care  including making healthy food choices, increasing physical activity and focusing on stress reduction.  - CBC with Differential/Platelet - Comprehensive metabolic panel - EKG 49-FWYO - Folate - Hemoglobin A1c - Insulin, random - Lipid panel  2. SOB (shortness of breath) on exertion Madeline Golden does feel that she gets out of breath more easily that she used to when she exercises.  Madeline Golden's shortness of breath appears to be obesity related and exercise induced. She has agreed to work on weight loss and gradually increase exercise to treat her exercise induced shortness of breath. Will continue to monitor closely.  3. Vitamin D deficiency Low Vitamin D level contributes to fatigue and are associated with obesity, breast, and colon cancer. We will check labs today. Madeline Golden will follow-up for routine testing of Vitamin D, at least 2-3 times per year to avoid over-replacement.  - VITAMIN D 25 Hydroxy (Vit-D Deficiency, Fractures)  4. B12 deficiency The diagnosis was reviewed with the patient. We will check labs today, and will follow up at [facility] next visit. Orders and follow up as documented in patient record.  - Vitamin B12 - vitamin B-12 (CYANOCOBALAMIN) 1000 MCG tablet; Take 1,000 mcg by mouth daily. Patient takes 2 tablets daily.  5. Hyperthyroidism We will check labs today, and will follow up at [facility] next visit. Orders and follow up as documented in patient record.  - T3, free - T4, free - TSH  6. Screening for depression Madeline Golden had a positive depression screening. Depression is commonly associated with obesity and often results in emotional eating behaviors. We will monitor this closely and work on CBT to help improve the non-hunger eating patterns. Referral to Psychology may be required if no improvement is seen as she continues in our clinic.  7. At risk for heart disease Madeline Golden was given approximately 30 minutes of coronary artery disease prevention counseling today. She is 45 y.o. female and has risk factors for heart disease including obesity. We discussed intensive lifestyle modifications today with an emphasis on specific weight loss instructions and strategies.   Repetitive spaced learning was employed today to elicit superior memory formation and behavioral change.  8. Obesity with current BMI of 33.4 Madeline Golden is currently in the action stage of  change and her goal is to continue with weight loss efforts. I recommend Madeline Golden begin the structured treatment plan as follows:  She has agreed to the Category 2 Plan + 100 calories.  Exercise goals: As is.   Behavioral modification strategies: increasing lean protein intake.  She was informed of the importance of frequent follow-up visits to maximize her success with intensive lifestyle modifications for her multiple health conditions. She was informed we would discuss her lab results at her next visit unless there is a critical issue that needs to be addressed sooner. Khrystyne agreed to keep her next visit at the agreed upon time to discuss these results.  Objective:   Blood pressure 125/84, pulse 79, temperature 98.4 F (36.9 C), height 5\' 8"  (1.727 m), weight 219 lb (99.3 kg), SpO2 99 %. Body mass index is 33.3 kg/m.  EKG: Normal sinus rhythm, rate 77 BPM.  Indirect Calorimeter completed today shows a VO2 of 199 and a REE of 1368.  Her calculated basal metabolic rate is 3785 thus her basal metabolic rate is worse than expected.  General: Cooperative, alert, well developed, in no acute distress. HEENT: Conjunctivae and lids unremarkable. Cardiovascular: Regular rhythm.  Lungs: Normal work of breathing. Neurologic: No focal deficits.   Lab Results  Component Value Date   CREATININE 0.95 11/06/2019   BUN 9 11/06/2019   NA 136 11/06/2019   K 3.7 11/06/2019   CL 103 11/06/2019   CO2 27 11/06/2019   Lab Results  Component Value Date   ALT 5 11/06/2019   AST 12 11/06/2019   ALKPHOS 62 11/06/2019   BILITOT 0.4 11/06/2019   No results found for: HGBA1C No results found for: INSULIN Lab Results  Component Value Date   TSH 0.64 03/24/2021   No results found for: CHOL, HDL, LDLCALC, LDLDIRECT, TRIG, CHOLHDL Lab Results  Component Value Date   WBC 10.5 11/06/2019   HGB 12.0 11/06/2019   HCT 35.7 (L) 11/06/2019   MCV 86.1 11/06/2019   PLT 385.0 11/06/2019   No results  found for: IRON, TIBC, FERRITIN  Attestation Statements:   Reviewed by clinician on day of visit: allergies, medications, problem list, medical history, surgical history, family history, social history, and previous encounter notes.   I, Trixie Dredge, am acting as transcriptionist for Dennard Nip, MD.  I have reviewed the above documentation for accuracy and completeness, and I agree with the above. - Dennard Nip, MD

## 2021-08-16 LAB — LIPID PANEL
Chol/HDL Ratio: 3.4 ratio (ref 0.0–4.4)
Cholesterol, Total: 223 mg/dL — ABNORMAL HIGH (ref 100–199)
HDL: 66 mg/dL (ref >39)
LDL Chol Calc (NIH): 149 mg/dL — ABNORMAL HIGH (ref 0–99)
Triglycerides: 47 mg/dL (ref 0–149)
VLDL Cholesterol Cal: 8 mg/dL (ref 5–40)

## 2021-08-16 LAB — CBC WITH DIFFERENTIAL/PLATELET
Basophils Absolute: 0.1 10*3/uL (ref 0.0–0.2)
Basos: 1 %
EOS (ABSOLUTE): 0.2 10*3/uL (ref 0.0–0.4)
Eos: 2 %
Hematocrit: 36.4 % (ref 34.0–46.6)
Hemoglobin: 11.9 g/dL (ref 11.1–15.9)
Immature Grans (Abs): 0.1 10*3/uL (ref 0.0–0.1)
Immature Granulocytes: 1 %
Lymphocytes Absolute: 2.7 10*3/uL (ref 0.7–3.1)
Lymphs: 23 %
MCH: 28.5 pg (ref 26.6–33.0)
MCHC: 32.7 g/dL (ref 31.5–35.7)
MCV: 87 fL (ref 79–97)
Monocytes Absolute: 0.6 10*3/uL (ref 0.1–0.9)
Monocytes: 6 %
Neutrophils Absolute: 7.9 10*3/uL — ABNORMAL HIGH (ref 1.4–7.0)
Neutrophils: 67 %
Platelets: 334 10*3/uL (ref 150–450)
RBC: 4.18 x10E6/uL (ref 3.77–5.28)
RDW: 13.7 % (ref 11.7–15.4)
WBC: 11.5 10*3/uL — ABNORMAL HIGH (ref 3.4–10.8)

## 2021-08-16 LAB — COMPREHENSIVE METABOLIC PANEL
ALT: 9 IU/L (ref 0–32)
AST: 19 IU/L (ref 0–40)
Albumin/Globulin Ratio: 1.6 (ref 1.2–2.2)
Albumin: 4.5 g/dL (ref 3.8–4.8)
Alkaline Phosphatase: 82 IU/L (ref 44–121)
BUN/Creatinine Ratio: 11 (ref 9–23)
BUN: 10 mg/dL (ref 6–24)
Bilirubin Total: 0.3 mg/dL (ref 0.0–1.2)
CO2: 26 mmol/L (ref 20–29)
Calcium: 9.8 mg/dL (ref 8.7–10.2)
Chloride: 102 mmol/L (ref 96–106)
Creatinine, Ser: 0.95 mg/dL (ref 0.57–1.00)
Globulin, Total: 2.9 g/dL (ref 1.5–4.5)
Glucose: 93 mg/dL (ref 70–99)
Potassium: 4.6 mmol/L (ref 3.5–5.2)
Sodium: 140 mmol/L (ref 134–144)
Total Protein: 7.4 g/dL (ref 6.0–8.5)
eGFR: 76 mL/min/{1.73_m2} (ref >59)

## 2021-08-16 LAB — HEMOGLOBIN A1C
Est. average glucose Bld gHb Est-mCnc: 134 mg/dL
Hgb A1c MFr Bld: 6.3 % — ABNORMAL HIGH (ref 4.8–5.6)

## 2021-08-16 LAB — VITAMIN B12: Vitamin B-12: 663 pg/mL (ref 232–1245)

## 2021-08-16 LAB — TSH: TSH: 4.16 u[IU]/mL (ref 0.450–4.500)

## 2021-08-16 LAB — T3, FREE: T3, Free: 2.7 pg/mL (ref 2.0–4.4)

## 2021-08-16 LAB — INSULIN, RANDOM: INSULIN: 6.1 u[IU]/mL (ref 2.6–24.9)

## 2021-08-16 LAB — VITAMIN D 25 HYDROXY (VIT D DEFICIENCY, FRACTURES): Vit D, 25-Hydroxy: 26.7 ng/mL — ABNORMAL LOW (ref 30.0–100.0)

## 2021-08-16 LAB — T4, FREE: Free T4: 1.06 ng/dL (ref 0.82–1.77)

## 2021-08-16 LAB — FOLATE: Folate: 7.4 ng/mL (ref >3.0)

## 2021-08-29 ENCOUNTER — Encounter (INDEPENDENT_AMBULATORY_CARE_PROVIDER_SITE_OTHER): Payer: Self-pay | Admitting: Family Medicine

## 2021-08-29 ENCOUNTER — Ambulatory Visit (INDEPENDENT_AMBULATORY_CARE_PROVIDER_SITE_OTHER): Payer: 59 | Admitting: Family Medicine

## 2021-08-29 ENCOUNTER — Other Ambulatory Visit: Payer: Self-pay

## 2021-08-29 VITALS — BP 122/78 | HR 69 | Temp 97.9°F | Ht 68.0 in | Wt 212.0 lb

## 2021-08-29 DIAGNOSIS — E7849 Other hyperlipidemia: Secondary | ICD-10-CM

## 2021-08-29 DIAGNOSIS — Z9189 Other specified personal risk factors, not elsewhere classified: Secondary | ICD-10-CM

## 2021-08-29 DIAGNOSIS — E669 Obesity, unspecified: Secondary | ICD-10-CM

## 2021-08-29 DIAGNOSIS — Z6833 Body mass index (BMI) 33.0-33.9, adult: Secondary | ICD-10-CM

## 2021-08-29 DIAGNOSIS — E559 Vitamin D deficiency, unspecified: Secondary | ICD-10-CM | POA: Diagnosis not present

## 2021-08-29 DIAGNOSIS — R7303 Prediabetes: Secondary | ICD-10-CM | POA: Diagnosis not present

## 2021-08-29 MED ORDER — VITAMIN D (ERGOCALCIFEROL) 1.25 MG (50000 UNIT) PO CAPS: 50000.0000 [IU] | ORAL_CAPSULE | ORAL | 0 refills | Status: DC

## 2021-08-29 NOTE — Progress Notes (Signed)
Chief Complaint:   OBESITY Dianely is here to discuss her progress with her obesity treatment plan along with follow-up of her obesity related diagnoses. Marrietta is on the Category 2 Plan + 100 calories and states she is following her eating plan approximately 50% of the time. Kenleigh states she is doing 0 minutes 0 times per week.  Today's visit was #: 2 Starting weight: 219 lbs Starting date: 08/15/2021 Today's weight: 212 lbs Today's date: 08/29/2021 Total lbs lost to date: 7 Total lbs lost since last in-office visit: 7  Interim History: Angelica has done well with weight loss. She struggled to eat breakfast and as she has not been used to eating breakfast. She also got bored with lunch and she tried to substitute. Then she became sick and she didn't eat much.  Subjective:   1. Hyperlipidemia, pure Rendy has a new diagnosis of hyperlipidemia. Her LDL is elevated at 149, but with normal HDL and triglycerides. She is now working on diet and exercise to help improve. I discussed labs with the patient today.  2. Vitamin D deficiency Mirren is on Vit D currently, and she has a history of Vit D deficiency. I discussed labs with the patient today.  3. Pre-diabetes Jennica has a new diagnosis of pre-diabetes. Her A1c is elevated at 6.3. She notes AM anorexia and PM polyphagia. She is ready to work on diet and exercise to avoid diabetes mellitus. I discussed labs with the patient today.  4. At risk for diabetes mellitus Doshie is at higher than average risk for developing diabetes due to obesity.   Assessment/Plan:   1. Hyperlipidemia, pure Cardiovascular risk and specific lipid/LDL goals reviewed. We discussed several lifestyle modifications today. Anylah will continue to work on diet, exercise and weight loss efforts. She is really wants to avoid medications. We will recheck labs in 3 months. Orders and follow up as documented in patient record.   2. Vitamin D deficiency Low Vitamin  D level contributes to fatigue and are associated with obesity, breast, and colon cancer. Missi agreed to start prescription Vitamin D 50,000 IU every week with no refills. We will recheck labs in 3 months. She will follow-up for routine testing of Vitamin D, at least 2-3 times per year to avoid over-replacement.  - Vitamin D, Ergocalciferol, (DRISDOL) 1.25 MG (50000 UNIT) CAPS capsule; Take 1 capsule (50,000 Units total) by mouth every 7 (seven) days.  Dispense: 4 capsule; Refill: 0  3. Pre-diabetes Adra will continue her lower simple carbohydrate eating plan, increase protein and vegetables to help decrease the risk of diabetes. We will recheck lab in 3 months.  4. At risk for diabetes mellitus Shantese was given approximately 30 minutes of diabetes education and counseling today. We discussed intensive lifestyle modifications today with an emphasis on weight loss as well as increasing exercise and decreasing simple carbohydrates in her diet. We also reviewed medication options with an emphasis on risk versus benefit of those discussed.   Repetitive spaced learning was employed today to elicit superior memory formation and behavioral change.  5. Obesity with current BMI of 32.4 Karne is currently in the action stage of change. As such, her goal is to continue with weight loss efforts. She has agreed to keeping a food journal and adhering to recommended goals of 1200-1400 calories and 80+ grams of protein daily.   Behavioral modification strategies: increasing lean protein intake, decreasing simple carbohydrates, increasing vegetables, and keeping a strict food journal.  Micky has  agreed to follow-up with our clinic in 2 weeks. She was informed of the importance of frequent follow-up visits to maximize her success with intensive lifestyle modifications for her multiple health conditions.   Objective:   Blood pressure 122/78, pulse 69, temperature 97.9 F (36.6 C), height 5\' 8"  (1.727 m),  weight 212 lb (96.2 kg), SpO2 97 %. Body mass index is 32.23 kg/m.  General: Cooperative, alert, well developed, in no acute distress. HEENT: Conjunctivae and lids unremarkable. Cardiovascular: Regular rhythm.  Lungs: Normal work of breathing. Neurologic: No focal deficits.   Lab Results  Component Value Date   CREATININE 0.95 08/15/2021   BUN 10 08/15/2021   NA 140 08/15/2021   K 4.6 08/15/2021   CL 102 08/15/2021   CO2 26 08/15/2021   Lab Results  Component Value Date   ALT 9 08/15/2021   AST 19 08/15/2021   ALKPHOS 82 08/15/2021   BILITOT 0.3 08/15/2021   Lab Results  Component Value Date   HGBA1C 6.3 (H) 08/15/2021   Lab Results  Component Value Date   INSULIN 6.1 08/15/2021   Lab Results  Component Value Date   TSH 4.160 08/15/2021   Lab Results  Component Value Date   CHOL 223 (H) 08/15/2021   HDL 66 08/15/2021   LDLCALC 149 (H) 08/15/2021   TRIG 47 08/15/2021   CHOLHDL 3.4 08/15/2021   Lab Results  Component Value Date   VD25OH 26.7 (L) 08/15/2021   Lab Results  Component Value Date   WBC 11.5 (H) 08/15/2021   HGB 11.9 08/15/2021   HCT 36.4 08/15/2021   MCV 87 08/15/2021   PLT 334 08/15/2021   No results found for: IRON, TIBC, FERRITIN  Attestation Statements:   Reviewed by clinician on day of visit: allergies, medications, problem list, medical history, surgical history, family history, social history, and previous encounter notes.   I, Trixie Dredge, am acting as transcriptionist for Dennard Nip, MD.  I have reviewed the above documentation for accuracy and completeness, and I agree with the above. -  Dennard Nip, MD

## 2021-09-13 ENCOUNTER — Ambulatory Visit (INDEPENDENT_AMBULATORY_CARE_PROVIDER_SITE_OTHER): Payer: 59 | Admitting: Family Medicine

## 2021-09-13 ENCOUNTER — Other Ambulatory Visit: Payer: Self-pay

## 2021-09-13 VITALS — BP 125/73 | HR 84 | Temp 98.2°F | Ht 68.0 in | Wt 215.0 lb

## 2021-09-13 DIAGNOSIS — E669 Obesity, unspecified: Secondary | ICD-10-CM

## 2021-09-13 DIAGNOSIS — Z6833 Body mass index (BMI) 33.0-33.9, adult: Secondary | ICD-10-CM

## 2021-09-13 DIAGNOSIS — M545 Low back pain, unspecified: Secondary | ICD-10-CM

## 2021-09-14 NOTE — Progress Notes (Signed)
Chief Complaint:   OBESITY Madeline Golden is here to discuss her progress with her obesity treatment plan along with follow-up of her obesity related diagnoses. Madeline Golden is on keeping a food journal and adhering to recommended goals of 1200-1400 calories and 80+ grams of protein daily and states she is following her eating plan approximately 25% of the time. Madeline Golden states she is walking on an average of 13,000 steps 5 times per week.   Today's visit was #: 3 Starting weight: 219 lbs Starting date: 08/15/2021 Today's weight: 215 lbs Today's date: 09/13/2021 Total lbs lost to date: 4 Total lbs lost since last in-office visit: 0  Interim History: Madeline Golden was on vacation and she did some celebration eating. She is retaining some fluid today, but otherwise she has done well maintaining her weight. She generally doesn't sleep well due to back pain.  Subjective:   1. Low back pain, unspecified back pain laterality, unspecified chronicity, unspecified whether sciatica present Madeline Golden notes chronic low back pain. She has had this worked up and has degenerative joint disease and some arthritis. It affects her sleep especially when sleeping on her right side.  Assessment/Plan:   1. Low back pain, unspecified back pain laterality, unspecified chronicity, unspecified whether sciatica present Madeline Golden will continue with diet and exercise, and will continue to monitor. She will continue Flexeril as needed.  2. Obesity with current BMI of 32.7 Madeline Golden is currently in the action stage of change. As such, her goal is to continue with weight loss efforts. She has agreed to keeping a food journal and adhering to recommended goals of 1200-1400 calories and 80+ grams of protein daily.   Exercise goals: As is.  Behavioral modification strategies: meal planning and cooking strategies.  Madeline Golden has agreed to follow-up with our clinic in 2 to 3 weeks. She was informed of the importance of frequent follow-up visits to  maximize her success with intensive lifestyle modifications for her multiple health conditions.   Objective:   Blood pressure 125/73, pulse 84, temperature 98.2 F (36.8 C), height 5\' 8"  (1.727 m), weight 215 lb (97.5 kg), SpO2 98 %. Body mass index is 32.69 kg/m.  General: Cooperative, alert, well developed, in no acute distress. HEENT: Conjunctivae and lids unremarkable. Cardiovascular: Regular rhythm.  Lungs: Normal work of breathing. Neurologic: No focal deficits.   Lab Results  Component Value Date   CREATININE 0.95 08/15/2021   BUN 10 08/15/2021   NA 140 08/15/2021   K 4.6 08/15/2021   CL 102 08/15/2021   CO2 26 08/15/2021   Lab Results  Component Value Date   ALT 9 08/15/2021   AST 19 08/15/2021   ALKPHOS 82 08/15/2021   BILITOT 0.3 08/15/2021   Lab Results  Component Value Date   HGBA1C 6.3 (H) 08/15/2021   Lab Results  Component Value Date   INSULIN 6.1 08/15/2021   Lab Results  Component Value Date   TSH 4.160 08/15/2021   Lab Results  Component Value Date   CHOL 223 (H) 08/15/2021   HDL 66 08/15/2021   LDLCALC 149 (H) 08/15/2021   TRIG 47 08/15/2021   CHOLHDL 3.4 08/15/2021   Lab Results  Component Value Date   VD25OH 26.7 (L) 08/15/2021   Lab Results  Component Value Date   WBC 11.5 (H) 08/15/2021   HGB 11.9 08/15/2021   HCT 36.4 08/15/2021   MCV 87 08/15/2021   PLT 334 08/15/2021   No results found for: IRON, TIBC, FERRITIN  Attestation Statements:  Reviewed by clinician on day of visit: allergies, medications, problem list, medical history, surgical history, family history, social history, and previous encounter notes.  Time spent on visit including pre-visit chart review and post-visit care and charting was 30 minutes.    I, Madeline Golden Dredge, am acting as transcriptionist for Dennard Nip, MD.  I have reviewed the above documentation for accuracy and completeness, and I agree with the above. -  Dennard Nip, MD

## 2021-09-27 ENCOUNTER — Encounter (INDEPENDENT_AMBULATORY_CARE_PROVIDER_SITE_OTHER): Payer: Self-pay | Admitting: Family Medicine

## 2021-09-27 ENCOUNTER — Ambulatory Visit (INDEPENDENT_AMBULATORY_CARE_PROVIDER_SITE_OTHER): Payer: 59 | Admitting: Family Medicine

## 2021-09-27 ENCOUNTER — Other Ambulatory Visit: Payer: Self-pay

## 2021-09-27 VITALS — BP 113/69 | HR 71 | Temp 97.9°F | Ht 68.0 in | Wt 216.0 lb

## 2021-09-27 DIAGNOSIS — E559 Vitamin D deficiency, unspecified: Secondary | ICD-10-CM

## 2021-09-27 DIAGNOSIS — Z6833 Body mass index (BMI) 33.0-33.9, adult: Secondary | ICD-10-CM

## 2021-09-27 DIAGNOSIS — E669 Obesity, unspecified: Secondary | ICD-10-CM | POA: Diagnosis not present

## 2021-09-27 DIAGNOSIS — Z9189 Other specified personal risk factors, not elsewhere classified: Secondary | ICD-10-CM | POA: Diagnosis not present

## 2021-09-27 MED ORDER — VITAMIN D (ERGOCALCIFEROL) 1.25 MG (50000 UNIT) PO CAPS: 50000.0000 [IU] | ORAL_CAPSULE | ORAL | 0 refills | Status: DC

## 2021-09-27 NOTE — Progress Notes (Signed)
Chief Complaint:   OBESITY Madeline Golden is here to discuss her progress with her obesity treatment plan along with follow-up of her obesity related diagnoses. Madeline Golden is on keeping a food journal and adhering to recommended goals of 1200-1400 calories and 80+ grams of protein daily and states she is following her eating plan approximately 70% of the time. Madeline Golden states she is walking and doing cardio for 30 minutes 3 times per week.  Today's visit was #: 4 Starting weight: 219 lbs Starting date: 08/15/2021 Today's weight: 216 lbs Today's date: 09/27/2021 Total lbs lost to date: 3 Total lbs lost since last in-office visit: 0  Interim History: Madeline Golden has had a few challenges recently and meal planning has been challenging. Overall her hunger is controlled. She is journaling most days, but her protein intake is only approximately 50% of her goal.  Subjective:   1. Vitamin D deficiency Madeline Golden is stable on Vit D, but her level is not yet at goal. She denies nausea, vomiting, or muscle weakness.  2. At risk for impaired metabolic function Madeline Golden is at increased risk for impaired metabolic function if protein decreases.  Assessment/Plan:   1. Vitamin D deficiency Low Vitamin D level contributes to fatigue and are associated with obesity, breast, and colon cancer. We will refill prescription Vitamin D for 1 month. Madeline Golden will follow-up for routine testing of Vitamin D, at least 2-3 times per year to avoid over-replacement.  - Vitamin D, Ergocalciferol, (DRISDOL) 1.25 MG (50000 UNIT) CAPS capsule; Take 1 capsule (50,000 Units total) by mouth every 7 (seven) days.  Dispense: 4 capsule; Refill: 0  2. At risk for impaired metabolic function Madeline Golden was given approximately 15 minutes of impaired  metabolic function prevention counseling today. We discussed intensive lifestyle modifications today with an emphasis on specific nutrition and exercise instructions and strategies.   Repetitive spaced  learning was employed today to elicit superior memory formation and behavioral change.  3. Obesity BMI today is 6 Madeline Golden is currently in the action stage of change. As such, her goal is to continue with weight loss efforts. She has agreed to keeping a food journal and adhering to recommended goals of 1200-1400 calories and 80+ grams of protein daily.   Exercise goals: As is.  Behavioral modification strategies: increasing lean protein intake and holiday eating strategies .  Madeline Golden has agreed to follow-up with our clinic in 2 to 3 weeks. She was informed of the importance of frequent follow-up visits to maximize her success with intensive lifestyle modifications for her multiple health conditions.   Objective:   Blood pressure 113/69, pulse 71, temperature 97.9 F (36.6 C), height 5\' 8"  (1.727 m), weight 216 lb (98 kg), SpO2 100 %. Body mass index is 32.84 kg/m.  General: Cooperative, alert, well developed, in no acute distress. HEENT: Conjunctivae and lids unremarkable. Cardiovascular: Regular rhythm.  Lungs: Normal work of breathing. Neurologic: No focal deficits.   Lab Results  Component Value Date   CREATININE 0.95 08/15/2021   BUN 10 08/15/2021   NA 140 08/15/2021   K 4.6 08/15/2021   CL 102 08/15/2021   CO2 26 08/15/2021   Lab Results  Component Value Date   ALT 9 08/15/2021   AST 19 08/15/2021   ALKPHOS 82 08/15/2021   BILITOT 0.3 08/15/2021   Lab Results  Component Value Date   HGBA1C 6.3 (H) 08/15/2021   Lab Results  Component Value Date   INSULIN 6.1 08/15/2021   Lab Results  Component  Value Date   TSH 4.160 08/15/2021   Lab Results  Component Value Date   CHOL 223 (H) 08/15/2021   HDL 66 08/15/2021   LDLCALC 149 (H) 08/15/2021   TRIG 47 08/15/2021   CHOLHDL 3.4 08/15/2021   Lab Results  Component Value Date   VD25OH 26.7 (L) 08/15/2021   Lab Results  Component Value Date   WBC 11.5 (H) 08/15/2021   HGB 11.9 08/15/2021   HCT 36.4  08/15/2021   MCV 87 08/15/2021   PLT 334 08/15/2021   No results found for: IRON, TIBC, FERRITIN  Attestation Statements:   Reviewed by clinician on day of visit: allergies, medications, problem list, medical history, surgical history, family history, social history, and previous encounter notes.   I, Trixie Dredge, am acting as transcriptionist for Dennard Nip, MD.  I have reviewed the above documentation for accuracy and completeness, and I agree with the above. -  Dennard Nip, MD

## 2021-10-16 ENCOUNTER — Other Ambulatory Visit: Payer: Self-pay

## 2021-10-16 ENCOUNTER — Ambulatory Visit (INDEPENDENT_AMBULATORY_CARE_PROVIDER_SITE_OTHER): Payer: 59 | Admitting: Family Medicine

## 2021-10-16 ENCOUNTER — Encounter (INDEPENDENT_AMBULATORY_CARE_PROVIDER_SITE_OTHER): Payer: Self-pay | Admitting: Family Medicine

## 2021-10-16 VITALS — BP 117/73 | HR 79 | Temp 97.7°F | Ht 66.0 in | Wt 214.0 lb

## 2021-10-16 DIAGNOSIS — E669 Obesity, unspecified: Secondary | ICD-10-CM

## 2021-10-16 DIAGNOSIS — Z9189 Other specified personal risk factors, not elsewhere classified: Secondary | ICD-10-CM

## 2021-10-16 DIAGNOSIS — Z6833 Body mass index (BMI) 33.0-33.9, adult: Secondary | ICD-10-CM | POA: Diagnosis not present

## 2021-10-16 DIAGNOSIS — E559 Vitamin D deficiency, unspecified: Secondary | ICD-10-CM

## 2021-10-17 NOTE — Progress Notes (Signed)
Chief Complaint:   OBESITY Madeline Golden is here to discuss her progress with her obesity treatment plan along with follow-up of her obesity related diagnoses. Madeline Golden is on keeping a food journal and adhering to recommended goals of 1200-1400 calories and 80+ grams of protein daily and states she is following her eating plan approximately 50% of the time. Madeline Golden states she is doing 0 minutes 0 times per week.  Today's visit was #: 5 Starting weight: 219 lbs Starting date: 08/15/2021 Today's weight: 214 lbs Today's date: 10/16/2021 Total lbs lost to date: 5 Total lbs lost since last in-office visit: 2  Interim History: Madeline Golden has done well with weight loss even over Thanksgiving. Her hunger has decreased and she has not been meeting her protein goals.  Subjective:   1. Vitamin D deficiency Madeline Golden is stable on Vit D, with no side effects noted. Her Vit D level is not yet at goal.  2. At risk for impaired metabolic function Madeline Golden is at increased risk for impaired metabolic function if protein stays too low.  Assessment/Plan:   1. Vitamin D deficiency Low Vitamin D level contributes to fatigue and are associated with obesity, breast, and colon cancer. Madeline Golden will continue prescription Vitamin D 50,000 IU every week and will follow-up for routine testing of Vitamin D, at least 2-3 times per year to avoid over-replacement.  2. At risk for impaired metabolic function Madeline Golden was given approximately 15 minutes of impaired  metabolic function prevention counseling today. We discussed intensive lifestyle modifications today with an emphasis on specific nutrition and exercise instructions and strategies.   Repetitive spaced learning was employed today to elicit superior memory formation and behavioral change.  3. Obesity BMI today is 14 Madeline Golden is currently in the action stage of change. As such, her goal is to continue with weight loss efforts. She has agreed to keeping a food journal and  adhering to recommended goals of 1200-1400 calories and 80+ grams of protein daily.  Behavioral modification strategies: increasing lean protein intake, no skipping meals, meal planning and cooking strategies, and holiday eating strategies .  Madeline Golden has agreed to follow-up with our clinic in 3 weeks. She was informed of the importance of frequent follow-up visits to maximize her success with intensive lifestyle modifications for her multiple health conditions.   Objective:   Blood pressure 117/73, pulse 79, temperature 97.7 F (36.5 C), height 5\' 6"  (1.676 m), weight 214 lb (97.1 kg), SpO2 99 %. Body mass index is 34.54 kg/m.  General: Cooperative, alert, well developed, in no acute distress. HEENT: Conjunctivae and lids unremarkable. Cardiovascular: Regular rhythm.  Lungs: Normal work of breathing. Neurologic: No focal deficits.   Lab Results  Component Value Date   CREATININE 0.95 08/15/2021   BUN 10 08/15/2021   NA 140 08/15/2021   K 4.6 08/15/2021   CL 102 08/15/2021   CO2 26 08/15/2021   Lab Results  Component Value Date   ALT 9 08/15/2021   AST 19 08/15/2021   ALKPHOS 82 08/15/2021   BILITOT 0.3 08/15/2021   Lab Results  Component Value Date   HGBA1C 6.3 (H) 08/15/2021   Lab Results  Component Value Date   INSULIN 6.1 08/15/2021   Lab Results  Component Value Date   TSH 4.160 08/15/2021   Lab Results  Component Value Date   CHOL 223 (H) 08/15/2021   HDL 66 08/15/2021   LDLCALC 149 (H) 08/15/2021   TRIG 47 08/15/2021   CHOLHDL 3.4 08/15/2021  Lab Results  Component Value Date   VD25OH 26.7 (L) 08/15/2021   Lab Results  Component Value Date   WBC 11.5 (H) 08/15/2021   HGB 11.9 08/15/2021   HCT 36.4 08/15/2021   MCV 87 08/15/2021   PLT 334 08/15/2021   No results found for: IRON, TIBC, FERRITIN  Attestation Statements:   Reviewed by clinician on day of visit: allergies, medications, problem list, medical history, surgical history, family  history, social history, and previous encounter notes.   I, Trixie Dredge, am acting as transcriptionist for Dennard Nip, MD.  I have reviewed the above documentation for accuracy and completeness, and I agree with the above. -  Dennard Nip, MD

## 2021-10-30 ENCOUNTER — Other Ambulatory Visit: Payer: Self-pay

## 2021-10-30 ENCOUNTER — Encounter (INDEPENDENT_AMBULATORY_CARE_PROVIDER_SITE_OTHER): Payer: Self-pay | Admitting: Family Medicine

## 2021-10-30 ENCOUNTER — Ambulatory Visit (INDEPENDENT_AMBULATORY_CARE_PROVIDER_SITE_OTHER): Payer: 59 | Admitting: Family Medicine

## 2021-10-30 VITALS — BP 120/75 | HR 78 | Temp 98.2°F | Ht 66.0 in | Wt 215.0 lb

## 2021-10-30 DIAGNOSIS — E559 Vitamin D deficiency, unspecified: Secondary | ICD-10-CM

## 2021-10-30 DIAGNOSIS — E669 Obesity, unspecified: Secondary | ICD-10-CM | POA: Diagnosis not present

## 2021-10-30 DIAGNOSIS — K581 Irritable bowel syndrome with constipation: Secondary | ICD-10-CM

## 2021-10-30 DIAGNOSIS — Z6834 Body mass index (BMI) 34.0-34.9, adult: Secondary | ICD-10-CM

## 2021-10-30 DIAGNOSIS — Z9189 Other specified personal risk factors, not elsewhere classified: Secondary | ICD-10-CM

## 2021-10-30 MED ORDER — VITAMIN D (ERGOCALCIFEROL) 1.25 MG (50000 UNIT) PO CAPS: 50000.0000 [IU] | ORAL_CAPSULE | ORAL | 0 refills | Status: DC

## 2021-10-30 NOTE — Progress Notes (Signed)
Chief Complaint:   OBESITY Madeline Golden is here to discuss her progress with her obesity treatment plan along with follow-up of her obesity related diagnoses. Madeline Golden is on keeping a food journal and adhering to recommended goals of 1200-1400 calories and 80+ grams of protein daily and states she is following her eating plan approximately 60% of the time. Madeline Golden states she is at the gym for 45-60 minutes 3 times per week.  Today's visit was #: 6 Starting weight: 219 lbs Starting date: 08/15/2021 Today's weight: 215 lbs Today's date: 10/30/2021 Total lbs lost to date: 4 Total lbs lost since last in-office visit: 0  Interim History: Madeline Golden has increased her exercise and she is doing both strengthening and cardio. She is struggling to meet her protein goals.  Subjective:   1. Irritable bowel syndrome with constipation [name] is on Senokot but she is getting minimal benefit especially while working on weight loss. She has tried miralax by itself previously without benefit.  2. Vitamin D deficiency Madeline Golden is stable on Vit D, but her level is not yet at goal. No side effects were noted.  3. At risk for impaired metabolic function Madeline Golden is at increased risk for impaired metabolic function if protein decreases.  Assessment/Plan:   1. Irritable bowel syndrome with constipation [name] is to increase her water intake, continue with exercise, and I advised her to take OTC Senokot and miralax together. We will follow up at her next visit. She was informed that a decrease in bowel movement frequency is normal while losing weight, but stools should not be hard or painful. Orders and follow up as documented in patient record.   2. Vitamin D deficiency We will refill prescription Vitamin D 50,000 IU every week for 1 month. [name] will follow-up for routine testing of Vitamin D, at least 2-3 times per year to avoid over-replacement.  - Vitamin D, Ergocalciferol, (DRISDOL) 1.25 MG (50000 UNIT) CAPS  capsule; Take 1 capsule (50,000 Units total) by mouth every 7 (seven) days.  Dispense: 4 capsule; Refill: 0  3. At risk for impaired metabolic function Alfonso was given approximately 15 minutes of impaired  metabolic function prevention counseling today. We discussed intensive lifestyle modifications today with an emphasis on specific nutrition and exercise instructions and strategies.   Repetitive spaced learning was employed today to elicit superior memory formation and behavioral change.  4. Obesity with current BMI of 34.8 Madeline Golden is currently in the action stage of change. As such, her goal is to continue with weight loss efforts. She has agreed to keeping a food journal and adhering to recommended goals of 1200-1400 calories and 80+ grams of protein daily.   Protein options were given to the patient, and "real food" protein is better than protein supplements when possible.  Exercise goals: As is.  Behavioral modification strategies: increasing lean protein intake.  Madeline Golden has agreed to follow-up with our clinic in 3 weeks. She was informed of the importance of frequent follow-up visits to maximize her success with intensive lifestyle modifications for her multiple health conditions.   Objective:   Blood pressure 120/75, pulse 78, temperature 98.2 F (36.8 C), height 5\' 6"  (1.676 m), weight 215 lb (97.5 kg), SpO2 99 %. Body mass index is 34.7 kg/m.  General: Cooperative, alert, well developed, in no acute distress. HEENT: Conjunctivae and lids unremarkable. Cardiovascular: Regular rhythm.  Lungs: Normal work of breathing. Neurologic: No focal deficits.   Lab Results  Component Value Date   CREATININE 0.95 08/15/2021  BUN 10 08/15/2021   NA 140 08/15/2021   K 4.6 08/15/2021   CL 102 08/15/2021   CO2 26 08/15/2021   Lab Results  Component Value Date   ALT 9 08/15/2021   AST 19 08/15/2021   ALKPHOS 82 08/15/2021   BILITOT 0.3 08/15/2021   Lab Results  Component Value  Date   HGBA1C 6.3 (H) 08/15/2021   Lab Results  Component Value Date   INSULIN 6.1 08/15/2021   Lab Results  Component Value Date   TSH 4.160 08/15/2021   Lab Results  Component Value Date   CHOL 223 (H) 08/15/2021   HDL 66 08/15/2021   LDLCALC 149 (H) 08/15/2021   TRIG 47 08/15/2021   CHOLHDL 3.4 08/15/2021   Lab Results  Component Value Date   VD25OH 26.7 (L) 08/15/2021   Lab Results  Component Value Date   WBC 11.5 (H) 08/15/2021   HGB 11.9 08/15/2021   HCT 36.4 08/15/2021   MCV 87 08/15/2021   PLT 334 08/15/2021   No results found for: IRON, TIBC, FERRITIN  Attestation Statements:   Reviewed by clinician on day of visit: allergies, medications, problem list, medical history, surgical history, family history, social history, and previous encounter notes.   I, Trixie Dredge, am acting as transcriptionist for Dennard Nip, MD.  I have reviewed the above documentation for accuracy and completeness, and I agree with the above. -  Dennard Nip, MD

## 2021-11-21 ENCOUNTER — Telehealth (INDEPENDENT_AMBULATORY_CARE_PROVIDER_SITE_OTHER): Payer: 59 | Admitting: Family Medicine

## 2021-11-21 ENCOUNTER — Encounter (INDEPENDENT_AMBULATORY_CARE_PROVIDER_SITE_OTHER): Payer: Self-pay | Admitting: Family Medicine

## 2021-11-21 DIAGNOSIS — Z6834 Body mass index (BMI) 34.0-34.9, adult: Secondary | ICD-10-CM

## 2021-11-21 DIAGNOSIS — E669 Obesity, unspecified: Secondary | ICD-10-CM | POA: Diagnosis not present

## 2021-11-21 DIAGNOSIS — U071 COVID-19: Secondary | ICD-10-CM | POA: Diagnosis not present

## 2021-11-22 NOTE — Progress Notes (Signed)
TeleHealth Visit:  Due to the COVID-19 pandemic, this visit was completed with telemedicine (audio/video) technology to reduce patient and provider exposure as well as to preserve personal protective equipment.   Madeline Golden has verbally consented to this TeleHealth visit. The patient is located at home, the provider is located at the [facility] office. The participants in this visit include the listed provider and patient. The visit was conducted today via MyChart video.   Chief Complaint: OBESITY Madeline Golden is here to discuss her progress with her obesity treatment plan along with follow-up of her obesity related diagnoses. Madeline Golden is on keeping a food journal and adhering to recommended goals of 1200-1400 calories and 80+ grams of protein daily and states she is following her eating plan approximately 30% of the time. Madeline Golden states she is at the gym/walking for 30 minutes 7 times per week.  Today's visit was #: 7 Starting weight: 219 lbs Starting date: 08/15/2021  Interim History: Madeline Golden has been on vacation and she now has COVID. She hasn't been able to concentrate on diet and weight loss.  Subjective:   1. COVID-19 Madeline Golden went on a cruise over the holidays, and multiple family and friends on the cruise now has COVID. She notes fatigue, shortness of breath, and pharyngitis for 3 days.   Assessment/Plan:   1. COVID-19 Pre-assumed COVID infection based on symptoms and multiple exposures. Madeline Golden is to isolate at least 5 days after symptoms started and after symptoms began to improve, but mask at work around other people for another week. She is to contact her primary care provider if her symptoms worsen or fail to improve.  2. Obesity with current BMI of 34.8 My is currently in the action stage of change. As such, her goal is to continue with weight loss efforts. She has agreed to keeping a food journal and adhering to recommended goals of 1200-1400 calories and 80+ grams  of protein daily.   Madeline Golden was encouraged to increase her water intake and work on sleeping until she feels better. She will slowly get back to journaling as tolerated.  Exercise goals: As is, when feeling better.  Behavioral modification strategies: increasing water intake.  Madeline Golden has agreed to follow-up with our clinic in 3 to 4 weeks. She was informed of the importance of frequent follow-up visits to maximize her success with intensive lifestyle modifications for her multiple health conditions.  Objective:   VITALS: Per patient if applicable, see vitals. GENERAL: Alert and in no acute distress. CARDIOPULMONARY: No increased WOB. Speaking in clear sentences.  PSYCH: Pleasant and cooperative. Speech normal rate and rhythm. Affect is appropriate. Insight and judgement are appropriate. Attention is focused, linear, and appropriate.  NEURO: Oriented as arrived to appointment on time with no prompting.   Lab Results  Component Value Date   CREATININE 0.95 08/15/2021   BUN 10 08/15/2021   NA 140 08/15/2021   K 4.6 08/15/2021   CL 102 08/15/2021   CO2 26 08/15/2021   Lab Results  Component Value Date   ALT 9 08/15/2021   AST 19 08/15/2021   ALKPHOS 82 08/15/2021   BILITOT 0.3 08/15/2021   Lab Results  Component Value Date   HGBA1C 6.3 (H) 08/15/2021   Lab Results  Component Value Date   INSULIN 6.1 08/15/2021   Lab Results  Component Value Date   TSH 4.160 08/15/2021   Lab Results  Component Value Date   CHOL 223 (H) 08/15/2021   HDL 66 08/15/2021  LDLCALC 149 (H) 08/15/2021   TRIG 47 08/15/2021   CHOLHDL 3.4 08/15/2021   Lab Results  Component Value Date   VD25OH 26.7 (L) 08/15/2021   Lab Results  Component Value Date   WBC 11.5 (H) 08/15/2021   HGB 11.9 08/15/2021   HCT 36.4 08/15/2021   MCV 87 08/15/2021   PLT 334 08/15/2021   No results found for: IRON, TIBC, FERRITIN  Attestation Statements:   Reviewed by clinician on day of visit: allergies,  medications, problem list, medical history, surgical history, family history, social history, and previous encounter notes.   I, Trixie Dredge, am acting as transcriptionist for Dennard Nip, MD.  I have reviewed the above documentation for accuracy and completeness, and I agree with the above. - Dennard Nip, MD

## 2021-12-05 ENCOUNTER — Other Ambulatory Visit: Payer: Self-pay

## 2021-12-05 ENCOUNTER — Ambulatory Visit (INDEPENDENT_AMBULATORY_CARE_PROVIDER_SITE_OTHER): Payer: 59 | Admitting: Family Medicine

## 2021-12-05 ENCOUNTER — Encounter (INDEPENDENT_AMBULATORY_CARE_PROVIDER_SITE_OTHER): Payer: Self-pay | Admitting: Family Medicine

## 2021-12-05 VITALS — BP 110/74 | HR 68 | Temp 98.3°F | Ht 66.0 in | Wt 217.0 lb

## 2021-12-05 DIAGNOSIS — E7849 Other hyperlipidemia: Secondary | ICD-10-CM

## 2021-12-05 DIAGNOSIS — R7303 Prediabetes: Secondary | ICD-10-CM

## 2021-12-05 DIAGNOSIS — E559 Vitamin D deficiency, unspecified: Secondary | ICD-10-CM | POA: Diagnosis not present

## 2021-12-05 DIAGNOSIS — Z6835 Body mass index (BMI) 35.0-35.9, adult: Secondary | ICD-10-CM

## 2021-12-05 DIAGNOSIS — Z6834 Body mass index (BMI) 34.0-34.9, adult: Secondary | ICD-10-CM

## 2021-12-05 DIAGNOSIS — E538 Deficiency of other specified B group vitamins: Secondary | ICD-10-CM

## 2021-12-05 DIAGNOSIS — Z9189 Other specified personal risk factors, not elsewhere classified: Secondary | ICD-10-CM

## 2021-12-05 DIAGNOSIS — E669 Obesity, unspecified: Secondary | ICD-10-CM

## 2021-12-05 NOTE — Progress Notes (Signed)
Chief Complaint:   OBESITY [name] is here to discuss her progress with her obesity treatment plan along with follow-up of her obesity related diagnoses. Madeline Golden is on keeping a food journal and adhering to recommended goals of 1200-1400 calories and 80+ grams of protein daily and states she is following her eating plan approximately 20% of the time. [name] states she is doing 0 minutes 0 times per week.  Today's visit was #: 8 Starting weight: 219 lbs Starting date: 08/15/2021 Today's weight: 217 lbs Today's date: 12/05/2021 Total lbs lost to date: 2 Total lbs lost since last in-office visit: 0  Interim History: Madeline Golden did some celebration eating over the holidays and while on vacation, but she has already gotten back on track. She is working on increasing vegetables and she is doing well. She is struggling to meet her protein goals.  Subjective:   1. Vitamin D deficiency [name] is on multivitamins currently, and she had been on Vit D prescription previously. Last Vit D level was very low.  2. Hyperlipidemia, pure Lovene is not on statin, and she is working on diet, exercise, and weight loss.  3. Pre-diabetes [name] is working on diet and decreasing simple carbohydrates.  4. B12 deficiency Madeline Golden is on OTC B12 supplementation, and she is due for labs.  5. At risk for heart disease Madeline Golden is at a higher than average risk for cardiovascular disease due to obesity.   Assessment/Plan:   1. Vitamin D deficiency Low Vitamin D level contributes to fatigue and are associated with obesity, breast, and colon cancer. We will check labs today. [name] will follow-up for routine testing of Vitamin D, at least 2-3 times per year to avoid over-replacement.  - VITAMIN D 25 Hydroxy (Vit-D Deficiency, Fractures)  2. Hyperlipidemia, pure Cardiovascular risk and specific lipid/LDL goals reviewed. We will check labs today. [name] will continue to work on diet, exercise and weight loss efforts.  Orders and follow up as documented in patient record.   - Lipid Panel With LDL/HDL Ratio  3. Pre-diabetes Madeline Golden will continue to work on weight loss, exercise, and decreasing simple carbohydrates to help decrease the risk of diabetes. We will check labs today.  - CMP14+EGFR - Insulin, random - Hemoglobin A1c  4. B12 deficiency The diagnosis was reviewed with the patient. We will check labs today. Orders and follow up as documented in patient record.  - Vitamin B12  5. At risk for heart disease Madeline Golden was given approximately 15 minutes of coronary artery disease prevention counseling today. She is 46 y.o. female and has risk factors for heart disease including obesity. We discussed intensive lifestyle modifications today with an emphasis on specific weight loss instructions and strategies.   Repetitive spaced learning was employed today to elicit superior memory formation and behavioral change.  6. Obesity with current BMI of 35.2 Madeline Golden is currently in the action stage of change. As such, her goal is to continue with weight loss efforts. She has agreed to keeping a food journal and adhering to recommended goals of 1200-1400 calories and 80+ grams of protein daily.   Behavioral modification strategies: increasing lean protein intake.  Madeline Golden has agreed to follow-up with our clinic in 3 weeks. She was informed of the importance of frequent follow-up visits to maximize her success with intensive lifestyle modifications for her multiple health conditions.   Madeline Golden was informed we would discuss her lab results at her next visit unless there is a critical issue that needs to be  addressed sooner. Madeline Golden agreed to keep her next visit at the agreed upon time to discuss these results.  Objective:   Blood pressure 110/74, pulse 68, temperature 98.3 F (36.8 C), height 5' 6"  (1.676 m), weight 217 lb (98.4 kg), SpO2 99 %. Body mass index is 35.02 kg/m.  General: Cooperative, alert, well  developed, in no acute distress. HEENT: Conjunctivae and lids unremarkable. Cardiovascular: Regular rhythm.  Lungs: Normal work of breathing. Neurologic: No focal deficits.   Lab Results  Component Value Date   CREATININE 0.95 08/15/2021   BUN 10 08/15/2021   NA 140 08/15/2021   K 4.6 08/15/2021   CL 102 08/15/2021   CO2 26 08/15/2021   Lab Results  Component Value Date   ALT 9 08/15/2021   AST 19 08/15/2021   ALKPHOS 82 08/15/2021   BILITOT 0.3 08/15/2021   Lab Results  Component Value Date   HGBA1C 6.3 (H) 08/15/2021   Lab Results  Component Value Date   INSULIN 6.1 08/15/2021   Lab Results  Component Value Date   TSH 4.160 08/15/2021   Lab Results  Component Value Date   CHOL 223 (H) 08/15/2021   HDL 66 08/15/2021   LDLCALC 149 (H) 08/15/2021   TRIG 47 08/15/2021   CHOLHDL 3.4 08/15/2021   Lab Results  Component Value Date   VD25OH 26.7 (L) 08/15/2021   Lab Results  Component Value Date   WBC 11.5 (H) 08/15/2021   HGB 11.9 08/15/2021   HCT 36.4 08/15/2021   MCV 87 08/15/2021   PLT 334 08/15/2021   No results found for: IRON, TIBC, FERRITIN  Attestation Statements:   Reviewed by clinician on day of visit: allergies, medications, problem list, medical history, surgical history, family history, social history, and previous encounter notes.   I, Trixie Dredge, am acting as transcriptionist for Dennard Nip, MD.  I have reviewed the above documentation for accuracy and completeness, and I agree with the above. -  Dennard Nip, MD

## 2021-12-06 LAB — CMP14+EGFR
ALT: 7 IU/L (ref 0–32)
AST: 16 IU/L (ref 0–40)
Albumin/Globulin Ratio: 1.8 (ref 1.2–2.2)
Albumin: 4.8 g/dL (ref 3.8–4.8)
Alkaline Phosphatase: 76 IU/L (ref 44–121)
BUN/Creatinine Ratio: 13 (ref 9–23)
BUN: 11 mg/dL (ref 6–24)
Bilirubin Total: 0.3 mg/dL (ref 0.0–1.2)
CO2: 23 mmol/L (ref 20–29)
Calcium: 9.5 mg/dL (ref 8.7–10.2)
Chloride: 104 mmol/L (ref 96–106)
Creatinine, Ser: 0.85 mg/dL (ref 0.57–1.00)
Globulin, Total: 2.6 g/dL (ref 1.5–4.5)
Glucose: 93 mg/dL (ref 70–99)
Potassium: 4.3 mmol/L (ref 3.5–5.2)
Sodium: 140 mmol/L (ref 134–144)
Total Protein: 7.4 g/dL (ref 6.0–8.5)
eGFR: 87 mL/min/{1.73_m2} (ref >59)

## 2021-12-06 LAB — HEMOGLOBIN A1C
Est. average glucose Bld gHb Est-mCnc: 117 mg/dL
Hgb A1c MFr Bld: 5.7 % — ABNORMAL HIGH (ref 4.8–5.6)

## 2021-12-06 LAB — INSULIN, RANDOM: INSULIN: 4.7 u[IU]/mL (ref 2.6–24.9)

## 2021-12-06 LAB — VITAMIN B12: Vitamin B-12: 767 pg/mL (ref 232–1245)

## 2021-12-06 LAB — LIPID PANEL WITH LDL/HDL RATIO
Cholesterol, Total: 230 mg/dL — ABNORMAL HIGH (ref 100–199)
HDL: 64 mg/dL (ref >39)
LDL Chol Calc (NIH): 159 mg/dL — ABNORMAL HIGH (ref 0–99)
LDL/HDL Ratio: 2.5 ratio (ref 0.0–3.2)
Triglycerides: 45 mg/dL (ref 0–149)
VLDL Cholesterol Cal: 7 mg/dL (ref 5–40)

## 2021-12-06 LAB — VITAMIN D 25 HYDROXY (VIT D DEFICIENCY, FRACTURES): Vit D, 25-Hydroxy: 33.7 ng/mL (ref 30.0–100.0)

## 2021-12-28 ENCOUNTER — Encounter (INDEPENDENT_AMBULATORY_CARE_PROVIDER_SITE_OTHER): Payer: Self-pay | Admitting: Family Medicine

## 2021-12-28 ENCOUNTER — Ambulatory Visit (INDEPENDENT_AMBULATORY_CARE_PROVIDER_SITE_OTHER): Payer: 59 | Admitting: Family Medicine

## 2021-12-28 ENCOUNTER — Emergency Department (HOSPITAL_COMMUNITY): Payer: 59

## 2021-12-28 ENCOUNTER — Other Ambulatory Visit: Payer: Self-pay

## 2021-12-28 ENCOUNTER — Emergency Department (HOSPITAL_COMMUNITY)
Admission: EM | Admit: 2021-12-28 | Discharge: 2021-12-28 | Disposition: A | Discharge status: Home / Self Care | Payer: 59 | Attending: Emergency Medicine | Admitting: Emergency Medicine

## 2021-12-28 ENCOUNTER — Encounter (HOSPITAL_COMMUNITY): Payer: Self-pay

## 2021-12-28 VITALS — BP 104/69 | HR 91 | Temp 98.3°F | Ht 66.0 in | Wt 214.0 lb

## 2021-12-28 DIAGNOSIS — E0590 Thyrotoxicosis, unspecified without thyrotoxic crisis or storm: Secondary | ICD-10-CM

## 2021-12-28 DIAGNOSIS — E669 Obesity, unspecified: Secondary | ICD-10-CM

## 2021-12-28 DIAGNOSIS — Z6834 Body mass index (BMI) 34.0-34.9, adult: Secondary | ICD-10-CM

## 2021-12-28 DIAGNOSIS — R7989 Other specified abnormal findings of blood chemistry: Secondary | ICD-10-CM | POA: Insufficient documentation

## 2021-12-28 DIAGNOSIS — Z859 Personal history of malignant neoplasm, unspecified: Secondary | ICD-10-CM | POA: Diagnosis not present

## 2021-12-28 DIAGNOSIS — R0789 Other chest pain: Secondary | ICD-10-CM

## 2021-12-28 LAB — CBC
HCT: 40.5 % (ref 36.0–46.0)
Hemoglobin: 13.1 g/dL (ref 12.0–15.0)
MCH: 28.5 pg (ref 26.0–34.0)
MCHC: 32.3 g/dL (ref 30.0–36.0)
MCV: 88.2 fL (ref 80.0–100.0)
Platelets: 452 10*3/uL — ABNORMAL HIGH (ref 150–400)
RBC: 4.59 MIL/uL (ref 3.87–5.11)
RDW: 13.2 % (ref 11.5–15.5)
WBC: 15.4 10*3/uL — ABNORMAL HIGH (ref 4.0–10.5)
nRBC: 0 % (ref 0.0–0.2)

## 2021-12-28 LAB — BASIC METABOLIC PANEL
Anion gap: 7 (ref 5–15)
BUN: 21 mg/dL — ABNORMAL HIGH (ref 6–20)
CO2: 27 mmol/L (ref 22–32)
Calcium: 9.2 mg/dL (ref 8.9–10.3)
Chloride: 100 mmol/L (ref 98–111)
Creatinine, Ser: 0.98 mg/dL (ref 0.44–1.00)
GFR, Estimated: >60 mL/min (ref >60)
Glucose, Bld: 108 mg/dL — ABNORMAL HIGH (ref 70–99)
Potassium: 3.6 mmol/L (ref 3.5–5.1)
Sodium: 134 mmol/L — ABNORMAL LOW (ref 135–145)

## 2021-12-28 LAB — TROPONIN I (HIGH SENSITIVITY)
Troponin I (High Sensitivity): 2 ng/L (ref <18)
Troponin I (High Sensitivity): 3 ng/L (ref <18)

## 2021-12-28 LAB — TSH: TSH: 7.997 u[IU]/mL — ABNORMAL HIGH (ref 0.350–4.500)

## 2021-12-28 LAB — I-STAT BETA HCG BLOOD, ED (MC, WL, AP ONLY): I-stat hCG, quantitative: <5 m[IU]/mL (ref <5)

## 2021-12-28 MED ORDER — LIDOCAINE VISCOUS HCL 2 % MT SOLN
15.0000 mL | Freq: Once | OROMUCOSAL | Status: AC
  Administered 2021-12-28: 15 mL via ORAL
  Filled 2021-12-28: qty 15

## 2021-12-28 MED ORDER — ALUM & MAG HYDROXIDE-SIMETH 200-200-20 MG/5ML PO SUSP
30.0000 mL | Freq: Once | ORAL | Status: AC
Start: 2021-12-28 — End: 2021-12-28
  Administered 2021-12-28: 30 mL via ORAL
  Filled 2021-12-28: qty 30

## 2021-12-28 NOTE — ED Triage Notes (Addendum)
Patient arrives from home with c/o chest pain. Pt reports centralized chest pain that is radiating up through her throat and between shoulder blades x 2 hours. Describes as burning pain. Denies SOB, headache, dizziness. Hx DVT, Thyroid Disease

## 2021-12-28 NOTE — ED Provider Notes (Signed)
[facility] DEPT Provider Note   CSN: [id number] Arrival date & time: 12/28/21  0022     History  Chief Complaint  Patient presents with   Chest Pain    Madeline Golden is a 46 y.o. female.  HPI     This 46 year old female who presents with chest discomfort.  Patient reports several hours of chest discomfort.  She reports that it is burning and radiates to her throat and shoulder blades.  Did not necessarily associated with eating.  She did have an operation last week but she was not intubated.  States she has never had pain like this before.  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  She was on anticoagulation for 1 year.  She denies any recent estrogen use, long travel, lower extremity swelling.  She denies any history of coronary artery disease.  Home Medications Prior to Admission medications   Medication Sig Start Date End Date Taking? Authorizing Provider  Ascorbic Acid (VITAMIN C) 1000 MG tablet Take 1,000 mg by mouth daily.    [provider]  cyclobenzaprine (FLEXERIL) 10 MG tablet cyclobenzaprine 10 mg tablet  TAKE 1 TABLET BY MOUTH THREE TIMES DAILY AS NEEDED FOR MUSCLE SPASM    [provider]  ibuprofen (ADVIL) 600 MG tablet Take 1 tablet (600 mg total) by mouth every 6 (six) hours as needed. 11/03/20   Wieters, Hallie C, PA-C  Multiple Vitamin (MULTIVITAMIN) tablet Take 1 tablet by mouth daily.    [provider]  vitamin B-12 (CYANOCOBALAMIN) 1000 MCG tablet Take 1,000 mcg by mouth daily. Patient takes 2 tablets daily.    [provider]      Allergies    Tape and Tegaderm ag mesh [silver]    Review of Systems   Review of Systems  Constitutional:  Negative for fever.  Respiratory:  Negative for shortness of breath.   Cardiovascular:  Positive for chest pain.  Gastrointestinal:  Negative for nausea and vomiting.  All other systems reviewed and are  negative.  Physical Exam Updated Vital Signs BP 126/72    Pulse 73    Temp 97.8 F (36.6 C) (Oral)    Resp 14    Ht 1.702 m (5\' 7" )    Wt 100.7 kg    SpO2 100%    BMI 34.77 kg/m  Physical Exam Vitals and nursing note reviewed.  Constitutional:      Appearance: She is well-developed. She is not ill-appearing.  HENT:     Head: Normocephalic and atraumatic.  Eyes:     Pupils: Pupils are equal, round, and reactive to light.  Cardiovascular:     Rate and Rhythm: Normal rate and regular rhythm.     Heart sounds: Normal heart sounds.  Pulmonary:     Effort: Pulmonary effort is normal. No respiratory distress.     Breath sounds: No wheezing.  Chest:     Comments: Scarring noted over the chest Abdominal:     General: Bowel sounds are normal.     Palpations: Abdomen is soft.  Musculoskeletal:     Cervical back: Neck supple.     Right lower leg: No tenderness. No edema.     Left lower leg: No tenderness. No edema.  Skin:    General: Skin is warm and dry.  Neurological:     Mental Status: She is alert and oriented to person, place, and time.  Psychiatric:  Mood and Affect: Mood normal.    ED Results / Procedures / Treatments   Labs (all labs ordered are listed, but only abnormal results are displayed) Labs Reviewed  BASIC METABOLIC PANEL - Abnormal; Notable for the following components:      Result Value   Sodium 134 (*)    Glucose, Bld 108 (*)    BUN 21 (*)    All other components within normal limits  CBC - Abnormal; Notable for the following components:   WBC 15.4 (*)    Platelets 452 (*)    All other components within normal limits  TSH - Abnormal; Notable for the following components:   TSH 7.997 (*)    All other components within normal limits  I-STAT BETA HCG BLOOD, ED ([facility], [facility], [facility] ONLY)  TROPONIN I (HIGH SENSITIVITY)  TROPONIN I (HIGH SENSITIVITY)    EKG EKG Interpretation  Date/Time:  Thursday December 28 2021 00:28:12 EST Ventricular Rate:  93 PR  Interval:  159 QRS Duration: 90 QT Interval:  331 QTC Calculation: 412 R Axis:   -15 Text Interpretation: Sinus rhythm Left ventricular hypertrophy Confirmed by Thayer Jew [id number]) on 12/28/2021 7:28:40 AM  Radiology DG Chest 2 View  Result Date: 12/28/2021 CLINICAL DATA:  Sudden onset mid chest pain radiating to the throat and shoulders. EXAM: CHEST - 2 VIEW COMPARISON:  None. FINDINGS: Normal heart size and pulmonary vascularity. No focal airspace disease or consolidation in the lungs. No blunting of costophrenic angles. No pneumothorax. Mediastinal contours appear intact. Degenerative changes in the spine. Surgical clips in the right breast. IMPRESSION: No active cardiopulmonary disease. Electronically Signed   By: Lucienne Capers M.D.   On: 12/28/2021 00:53    Procedures Procedures    Medications Ordered in ED Medications  alum & mag hydroxide-simeth (MAALOX/MYLANTA) 200-200-20 MG/5ML suspension 30 mL (30 mLs Oral Given During Downtime 12/28/21 0145)    And  lidocaine (XYLOCAINE) 2 % viscous mouth solution 15 mL (15 mLs Oral Given 12/28/21 0145)    ED Course/ Medical Decision Making/ A&P                           Medical Decision Making Amount and/or Complexity of Data Reviewed Labs: ordered. Radiology: ordered.  Risk OTC drugs. Prescription drug management.   This patient presents to the ED for concern of chest pain, this involves an extensive number of treatment options, and is a complaint that carries with it a high risk of complications and morbidity.  The differential diagnosis includes ACS, PE, reflux, gastritis, pneumonia, pneumothorax  MDM:    This is a 46 year old female who presents with chest discomfort.  Described as burning.  She is nontoxic and vital signs are reassuring.  EKG shows no evidence of acute ischemia.  Story is very atypical for ACS.  More highly suspicious for GI etiology.  She was given a GI cocktail.  Troponin x2 negative.  Given her risk  factors, feel this effectively rules out ACS.  She does have a history of DVT in the setting of birth control use.  Vital signs are stable.  She is satting 100%.  She is PERC negative and doubt PE.  Chest x-ray shows no evidence of pneumothorax or pneumonia.  Patient had improvement with GI cocktail.  Will discharge with omeprazole. (Labs, imaging)  Labs: I Ordered, and personally interpreted labs.  The pertinent results include: Normal troponin x2  Imaging Studies ordered: I ordered imaging studies including  chest x-ray I independently visualized and interpreted imaging. I agree with the radiologist interpretation  Additional history obtained from patient.  External records from outside source obtained and reviewed including visits  Critical Interventions: GI cocktail and pain management  Consultations: I requested consultation with the none,  and discussed lab and imaging findings as well as pertinent plan - they recommend: None  Cardiac Monitoring: The patient was maintained on a cardiac monitor.  I personally viewed and interpreted the cardiac monitored which showed an underlying rhythm of: Normal sinus rhythm  Reevaluation: After the interventions noted above, I reevaluated the patient and found that they have :improved   Considered admission for: Chest pain rule out  Social Determinants of Health: Lives independently  Disposition: Discharge  Co morbidities that complicate the patient evaluation  Past Medical History:  Diagnosis Date   Anxiety    Back pain    Cancer ([place])    Constipation    DVT (deep venous thrombosis) ([place]) 2005   No known cause    Edema of both lower extremities    Family history of breast cancer    Graves disease    Hyperthyroidism    IBS (irritable bowel syndrome)    Infertility, female    Joint pain    Vitamin D deficiency      Medicines Meds ordered this encounter  Medications   AND Linked Order Group    alum & mag hydroxide-simeth  (MAALOX/MYLANTA) 200-200-20 MG/5ML suspension 30 mL    lidocaine (XYLOCAINE) 2 % viscous mouth solution 15 mL    I have reviewed the patients home medicines and have made adjustments as needed  Problem List / ED Course: Problem List Items Addressed This Visit   None               Final Clinical Impression(s) / ED Diagnoses Final diagnoses:  None    Rx / DC Orders ED Discharge Orders     None         Jolon Degante, Barbette Hair, MD 12/28/21 0730

## 2021-12-31 NOTE — Progress Notes (Signed)
Chief Complaint:   OBESITY Lateisha is here to discuss her progress with her obesity treatment plan along with follow-up of her obesity related diagnoses. Kynlei is on keeping a food journal and adhering to recommended goals of 1200-1800 calories and 80+ grams of protein daily and states she is following her eating plan approximately 0% of the time. Stormee states she is doing 0 minutes 0 times per week.  Today's visit was #: 9 Starting weight: 219 lbs Starting date: 08/15/2021 Today's weight: 214 lbs Today's date: 12/28/2021 Total lbs lost to date: 5 Total lbs lost since last in-office visit: 3  Interim History: Barbera has done well with  weight loss since her last visit. She notes she hasn't been able to focus on herself due to stress since her last visit. She had eye surgery and she is buying a house. She would like to recover from surgery, work on her new house, and would like to return in 4 months. She feels at that time she will be able to focus more on herself and her weight loss journey.  Subjective:   1. Hyperthyroidism Reylynn's last TSH was out of range at 7.997. She is not currently non any medications.  Assessment/Plan:   1. Hyperthyroidism Safia needs to follow up with her primary care provider for recheck.  2. Obesity with current BMI of 34.6 Rozanne is currently in the action stage of change. As such, her goal is to continue with weight loss efforts. She has agreed to keeping a food journal and adhering to recommended goals of 1200-1800 calories and 80+ grams of protein daily.   Behavioral modification strategies: increasing lean protein intake and increasing water intake.  Delberta has agreed to follow-up with our clinic in 4 months. She was informed of the importance of frequent follow-up visits to maximize her success with intensive lifestyle modifications for her multiple health conditions.   Objective:   Blood pressure 104/69, pulse 91, temperature 98.3 F (36.8  C), height 5\' 6"  (1.676 m), weight 214 lb (97.1 kg), SpO2 98 %. Body mass index is 34.54 kg/m.  General: Cooperative, alert, well developed, in no acute distress. HEENT: Conjunctivae and lids unremarkable. Cardiovascular: Regular rhythm.  Lungs: Normal work of breathing. Neurologic: No focal deficits.   Lab Results  Component Value Date   CREATININE 0.98 12/28/2021   BUN 21 (H) 12/28/2021   NA 134 (L) 12/28/2021   K 3.6 12/28/2021   CL 100 12/28/2021   CO2 27 12/28/2021   Lab Results  Component Value Date   ALT 7 12/05/2021   AST 16 12/05/2021   ALKPHOS 76 12/05/2021   BILITOT 0.3 12/05/2021   Lab Results  Component Value Date   HGBA1C 5.7 (H) 12/05/2021   HGBA1C 6.3 (H) 08/15/2021   Lab Results  Component Value Date   INSULIN 4.7 12/05/2021   INSULIN 6.1 08/15/2021   Lab Results  Component Value Date   TSH 7.997 (H) 12/28/2021   Lab Results  Component Value Date   CHOL 230 (H) 12/05/2021   HDL 64 12/05/2021   LDLCALC 159 (H) 12/05/2021   TRIG 45 12/05/2021   CHOLHDL 3.4 08/15/2021   Lab Results  Component Value Date   VD25OH 33.7 12/05/2021   VD25OH 26.7 (L) 08/15/2021   Lab Results  Component Value Date   WBC 15.4 (H) 12/28/2021   HGB 13.1 12/28/2021   HCT 40.5 12/28/2021   MCV 88.2 12/28/2021   PLT 452 (H) 12/28/2021  No results found for: IRON, TIBC, FERRITIN  Attestation Statements:   Reviewed by clinician on day of visit: allergies, medications, problem list, medical history, surgical history, family history, social history, and previous encounter notes.  Time spent on visit including pre-visit chart review and post-visit care and charting was 32 minutes.    I, Trixie Dredge, am acting as transcriptionist for Dennard Nip, MD.  I have reviewed the above documentation for accuracy and completeness, and I agree with the above. -  Dennard Nip, MD

## 2022-01-18 ENCOUNTER — Ambulatory Visit (INDEPENDENT_AMBULATORY_CARE_PROVIDER_SITE_OTHER): Payer: 59 | Admitting: Family Medicine

## 2022-02-11 IMAGING — CR DG CHEST 2V
2 series · 2 of 2 positions shown · non-contrast
Comparison: None.

CLINICAL DATA: Sudden onset mid chest pain radiating to the throat
and shoulders.

EXAM:
CHEST - 2 VIEW

[w chest pa]
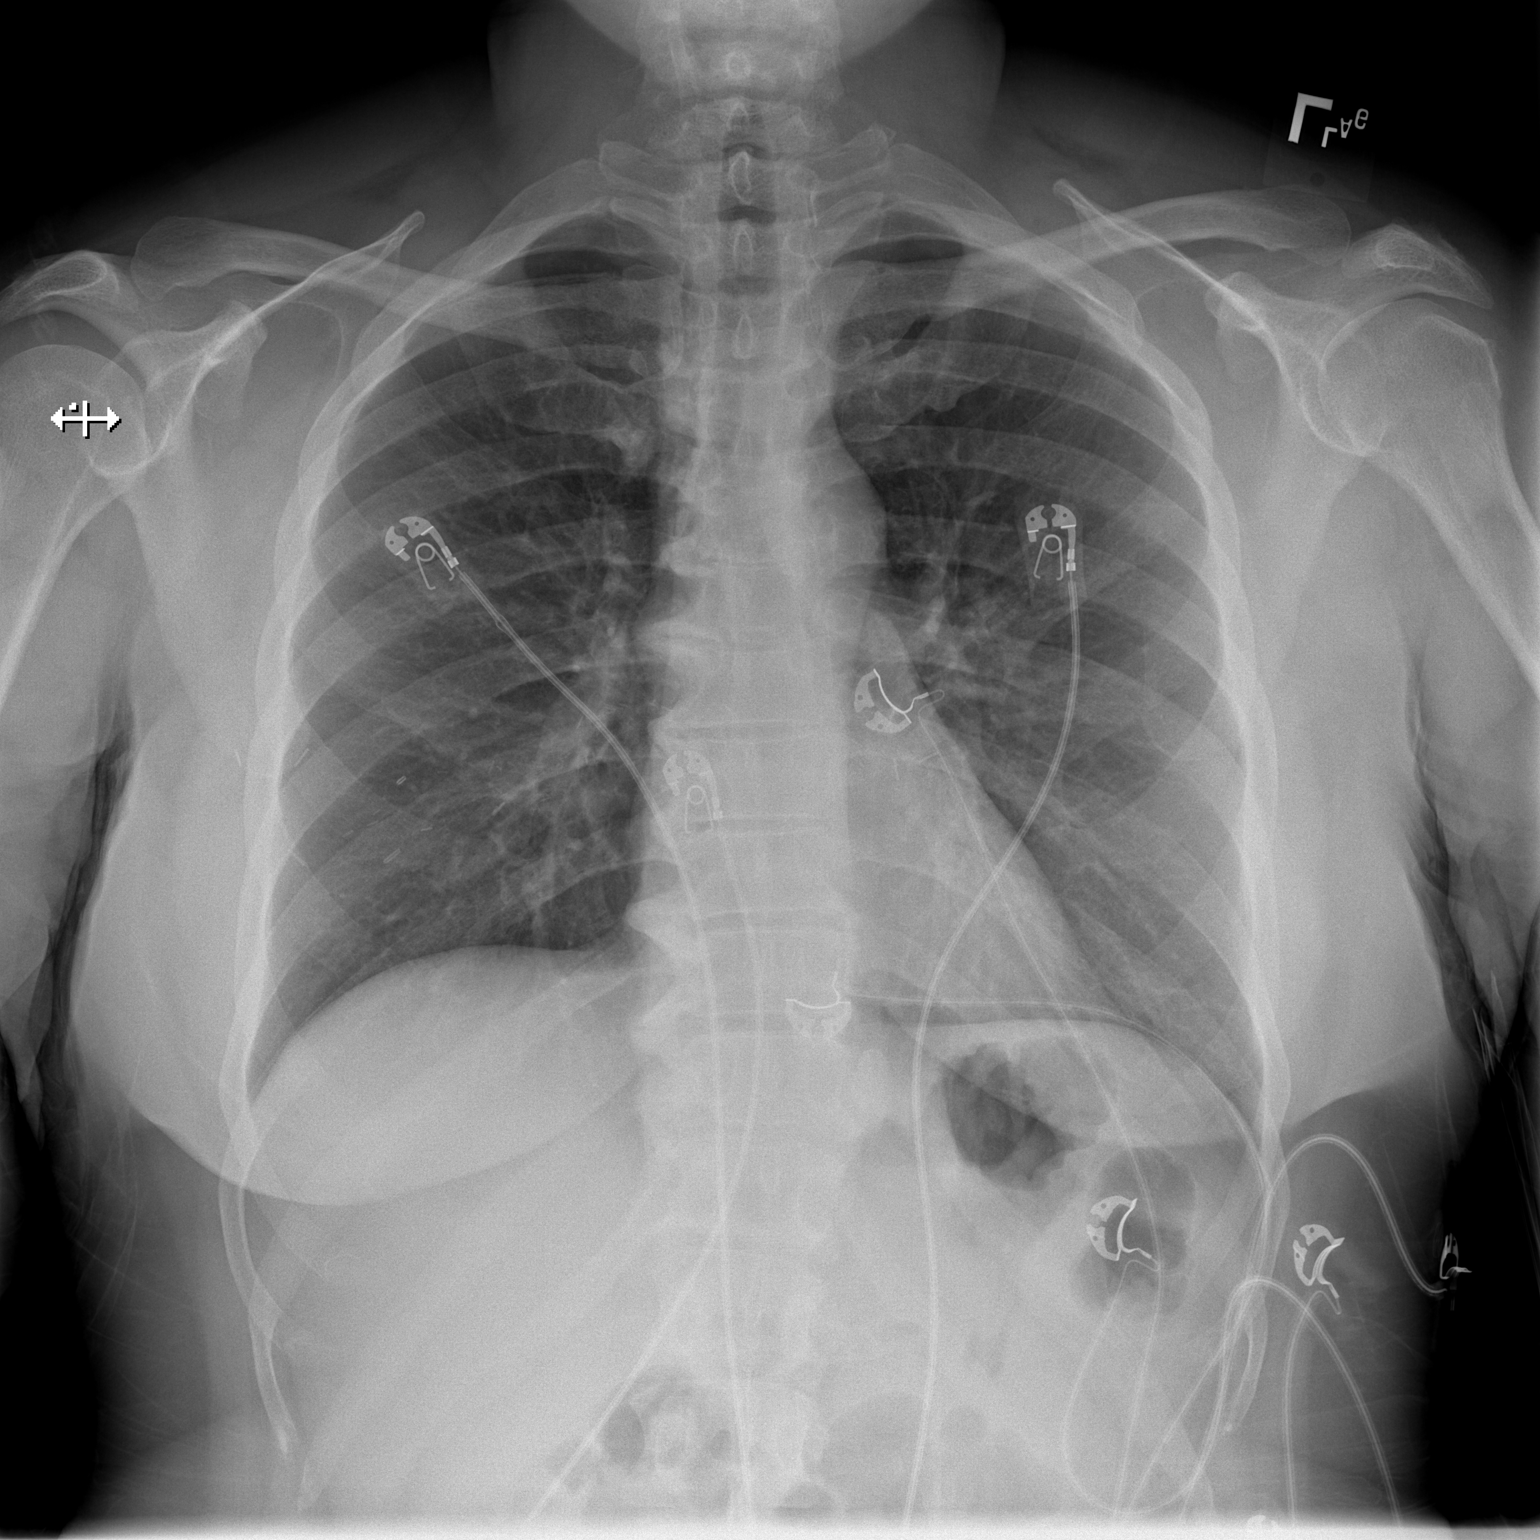

[w chest lat]
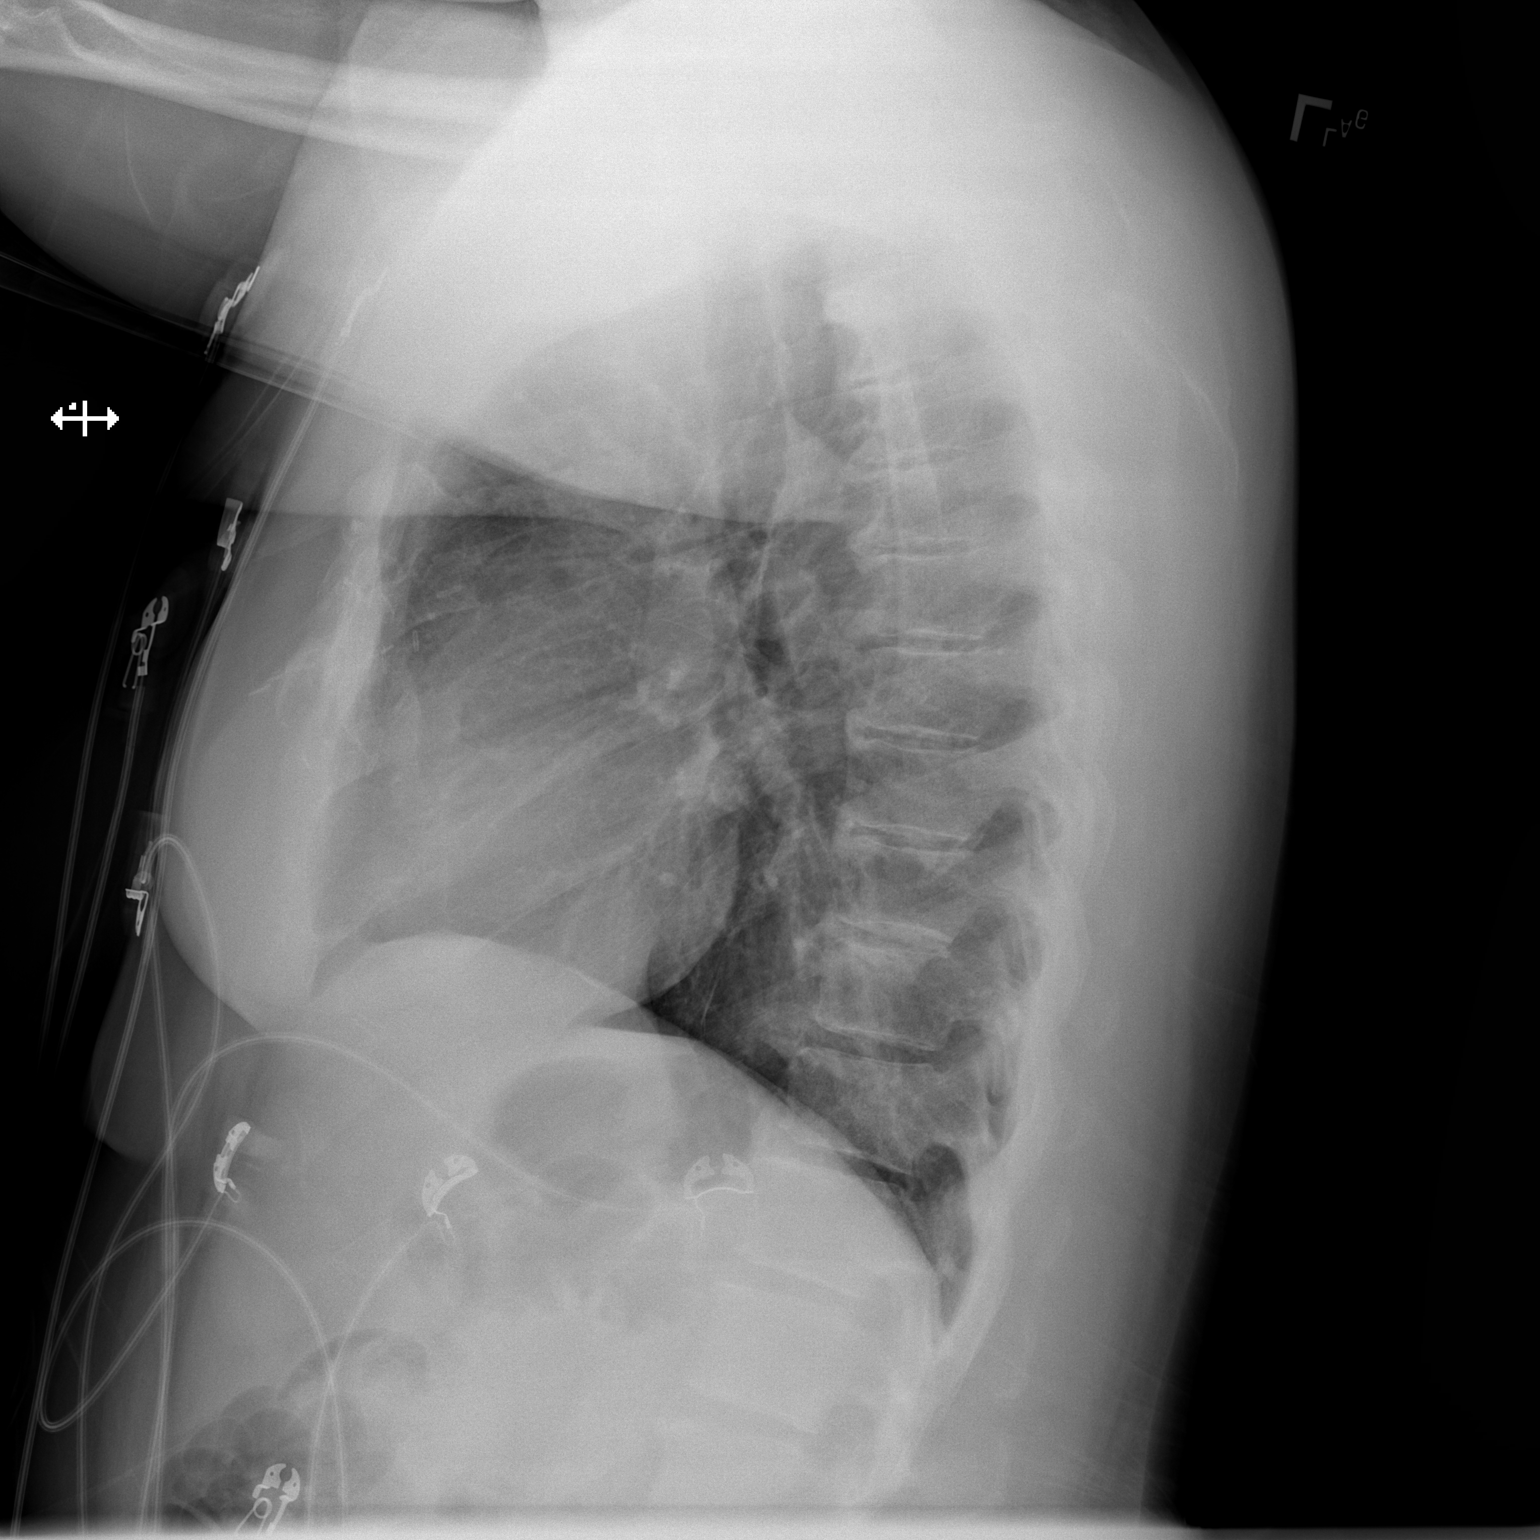

[2 of 2 positions shown; findings below may reference images not displayed]

FINDINGS: Normal heart size and pulmonary vascularity. No focal airspace
disease or consolidation in the lungs. No blunting of costophrenic
angles. No pneumothorax. Mediastinal contours appear intact.
Degenerative changes in the spine. Surgical clips in the right
breast.
IMPRESSION: No active cardiopulmonary disease.

## 2022-04-30 ENCOUNTER — Ambulatory Visit (INDEPENDENT_AMBULATORY_CARE_PROVIDER_SITE_OTHER): Payer: 59 | Admitting: Family Medicine

## 2022-05-01 ENCOUNTER — Ambulatory Visit (INDEPENDENT_AMBULATORY_CARE_PROVIDER_SITE_OTHER): Payer: 59 | Admitting: Family Medicine

## 2022-05-01 ENCOUNTER — Encounter (INDEPENDENT_AMBULATORY_CARE_PROVIDER_SITE_OTHER): Payer: Self-pay | Admitting: Family Medicine

## 2022-05-01 VITALS — BP 115/74 | HR 71 | Temp 98.1°F | Ht 67.0 in | Wt 216.0 lb

## 2022-05-01 DIAGNOSIS — E669 Obesity, unspecified: Secondary | ICD-10-CM

## 2022-05-01 DIAGNOSIS — R7989 Other specified abnormal findings of blood chemistry: Secondary | ICD-10-CM

## 2022-05-01 DIAGNOSIS — E559 Vitamin D deficiency, unspecified: Secondary | ICD-10-CM | POA: Diagnosis not present

## 2022-05-01 DIAGNOSIS — E785 Hyperlipidemia, unspecified: Secondary | ICD-10-CM | POA: Diagnosis not present

## 2022-05-01 DIAGNOSIS — R7303 Prediabetes: Secondary | ICD-10-CM | POA: Diagnosis not present

## 2022-05-01 DIAGNOSIS — Z6834 Body mass index (BMI) 34.0-34.9, adult: Secondary | ICD-10-CM

## 2022-05-01 MED ORDER — VITAMIN D (ERGOCALCIFEROL) 1.25 MG (50000 UNIT) PO CAPS: 50000.0000 [IU] | ORAL_CAPSULE | ORAL | 0 refills | Status: AC

## 2022-05-02 LAB — CBC WITH DIFFERENTIAL/PLATELET
Basophils Absolute: 0.1 10*3/uL (ref 0.0–0.2)
Basos: 1 %
EOS (ABSOLUTE): 0.1 10*3/uL (ref 0.0–0.4)
Eos: 1 %
Hematocrit: 40.6 % (ref 34.0–46.6)
Hemoglobin: 13.1 g/dL (ref 11.1–15.9)
Immature Grans (Abs): 0 10*3/uL (ref 0.0–0.1)
Immature Granulocytes: 0 %
Lymphocytes Absolute: 3.6 10*3/uL — ABNORMAL HIGH (ref 0.7–3.1)
Lymphs: 44 %
MCH: 28.5 pg (ref 26.6–33.0)
MCHC: 32.3 g/dL (ref 31.5–35.7)
MCV: 89 fL (ref 79–97)
Monocytes Absolute: 0.5 10*3/uL (ref 0.1–0.9)
Monocytes: 6 %
Neutrophils Absolute: 3.9 10*3/uL (ref 1.4–7.0)
Neutrophils: 48 %
Platelets: 372 10*3/uL (ref 150–450)
RBC: 4.59 x10E6/uL (ref 3.77–5.28)
RDW: 13.1 % (ref 11.7–15.4)
WBC: 8.1 10*3/uL (ref 3.4–10.8)

## 2022-05-02 LAB — CMP14+EGFR
ALT: 6 IU/L (ref 0–32)
AST: 19 IU/L (ref 0–40)
Albumin/Globulin Ratio: 1.6 (ref 1.2–2.2)
Albumin: 4.9 g/dL — ABNORMAL HIGH (ref 3.8–4.8)
Alkaline Phosphatase: 68 IU/L (ref 44–121)
BUN/Creatinine Ratio: 11 (ref 9–23)
BUN: 12 mg/dL (ref 6–24)
Bilirubin Total: 0.4 mg/dL (ref 0.0–1.2)
CO2: 20 mmol/L (ref 20–29)
Calcium: 10 mg/dL (ref 8.7–10.2)
Chloride: 99 mmol/L (ref 96–106)
Creatinine, Ser: 1.08 mg/dL — ABNORMAL HIGH (ref 0.57–1.00)
Globulin, Total: 3 g/dL (ref 1.5–4.5)
Glucose: 80 mg/dL (ref 70–99)
Potassium: 4.4 mmol/L (ref 3.5–5.2)
Sodium: 137 mmol/L (ref 134–144)
Total Protein: 7.9 g/dL (ref 6.0–8.5)
eGFR: 65 mL/min/{1.73_m2} (ref >59)

## 2022-05-02 LAB — LIPID PANEL WITH LDL/HDL RATIO
Cholesterol, Total: 240 mg/dL — ABNORMAL HIGH (ref 100–199)
HDL: 57 mg/dL (ref >39)
LDL Chol Calc (NIH): 173 mg/dL — ABNORMAL HIGH (ref 0–99)
LDL/HDL Ratio: 3 ratio (ref 0.0–3.2)
Triglycerides: 63 mg/dL (ref 0–149)
VLDL Cholesterol Cal: 10 mg/dL (ref 5–40)

## 2022-05-02 LAB — HEMOGLOBIN A1C
Est. average glucose Bld gHb Est-mCnc: 128 mg/dL
Hgb A1c MFr Bld: 6.1 % — ABNORMAL HIGH (ref 4.8–5.6)

## 2022-05-02 LAB — TSH: TSH: 2.71 u[IU]/mL (ref 0.450–4.500)

## 2022-05-02 LAB — INSULIN, RANDOM: INSULIN: 5.3 u[IU]/mL (ref 2.6–24.9)

## 2022-05-02 LAB — VITAMIN D 25 HYDROXY (VIT D DEFICIENCY, FRACTURES): Vit D, 25-Hydroxy: 35.7 ng/mL (ref 30.0–100.0)

## 2022-05-02 NOTE — Progress Notes (Signed)
Chief Complaint:   OBESITY Madeline Golden is here to discuss her progress with her obesity treatment plan along with follow-up of her obesity related diagnoses. Madeline Golden is on keeping a food journal and adhering to recommended goals of 1200-1800 calories and 80+ grams of protein daily and states she is following her eating plan approximately 0% of the time. Madeline Golden states she is weight lifting, and walking for 30 minutes 3 times per week.  Today's visit was #: 10 Starting weight: 219 lbs Starting date: 08/15/2021 Today's weight: 216 lbs Today's date: 05/01/2022 Total lbs lost to date: 3 Total lbs lost since last in-office visit: 0  Interim History: Madeline Golden's last visit was approximately 4 months ago.  She has fluctuated in weight during this time, but she has been able to maintain her weight.  She will be traveling a lot in the next month and would like to discuss travel/vacation strategies.  Subjective:   1. Vitamin D deficiency Madeline Golden is on vitamin D, and she is due to have labs rechecked to monitor her progress.  2. Hyperlipidemia, unspecified hyperlipidemia type Madeline Golden is working on losing weight and trying to decrease cholesterol in her diet.  She is due for labs.  3. Pre-diabetes Madeline Golden is working on her diet, exercise, and weight loss.  She is due for labs.  4. TSH elevation Madeline Golden's last TSH was elevated, with normal T3, and T4.  She notes fatigue.  Assessment/Plan:   1. Vitamin D deficiency We will check labs today, and we will refill prescription Vitamin D for 1 month. Madeline Golden will follow-up for routine testing of Vitamin D, at least 2-3 times per year to avoid over-replacement.  - VITAMIN D 25 Hydroxy (Vit-D Deficiency, Fractures) - Vitamin D, Ergocalciferol, (DRISDOL) 1.25 MG (50000 UNIT) CAPS capsule; Take 1 capsule (50,000 Units total) by mouth every 7 (seven) days.  Dispense: 4 capsule; Refill: 0  2. Hyperlipidemia, unspecified hyperlipidemia type Cardiovascular risk and  specific lipid/LDL goals reviewed.  We discussed several lifestyle modifications today. We will check labs today. Madeline Golden will continue to work on diet, exercise and weight loss efforts. Orders and follow up as documented in patient record.   - CBC with Differential/Platelet - Lipid Panel With LDL/HDL Ratio  3. Pre-diabetes We will check labs today. Madeline Golden will continue to work on weight loss, exercise, and decreasing simple carbohydrates to help decrease the risk of diabetes.   - CMP14+EGFR - Insulin, random - Hemoglobin A1c  4. TSH elevation We will check labs today, and will follow up at Madeline Golden next visit. Orders and follow up as documented in patient record.  - TSH  5. Obesity, Current BMI 34.0 Madeline Golden is currently in the action stage of change. As such, her goal is to continue with weight loss efforts. She has agreed to the Category 3 Plan.   Exercise goals: As is.  Behavioral modification strategies: increasing lean protein intake, decreasing liquid calories, meal planning and cooking strategies, travel eating strategies, and celebration eating strategies.  Madeline Golden has agreed to follow-up with our clinic in 6 weeks. She was informed of the importance of frequent follow-up visits to maximize her success with intensive lifestyle modifications for her multiple health conditions.   Madeline Golden was informed we would discuss her lab results at her next visit unless there is a critical issue that needs to be addressed sooner. Madeline Golden agreed to keep her next visit at the agreed upon time to discuss these results.  Objective:   Blood pressure 115/74, pulse  71, temperature 98.1 F (36.7 C), height _0  (1.702 m), weight 216 lb (98 kg), SpO2 99 %. Body mass index is 33.83 kg/m.  General: Cooperative, alert, well developed, in no acute distress. HEENT: Conjunctivae and lids unremarkable. Cardiovascular: Regular rhythm.  Lungs: Normal work of breathing. Neurologic: No focal deficits.    Lab Results  Component Value Date   CREATININE 1.08 (H) 05/01/2022   BUN 12 05/01/2022   NA 137 05/01/2022   K 4.4 05/01/2022   CL 99 05/01/2022   CO2 20 05/01/2022   Lab Results  Component Value Date   ALT 6 05/01/2022   AST 19 05/01/2022   ALKPHOS 68 05/01/2022   BILITOT 0.4 05/01/2022   Lab Results  Component Value Date   HGBA1C 6.1 (H) 05/01/2022   HGBA1C 5.7 (H) 12/05/2021   HGBA1C 6.3 (H) 08/15/2021   Lab Results  Component Value Date   INSULIN 5.3 05/01/2022   INSULIN 4.7 12/05/2021   INSULIN 6.1 08/15/2021   Lab Results  Component Value Date   TSH 2.710 05/01/2022   Lab Results  Component Value Date   CHOL 240 (H) 05/01/2022   HDL 57 05/01/2022   LDLCALC 173 (H) 05/01/2022   TRIG 63 05/01/2022   CHOLHDL 3.4 08/15/2021   Lab Results  Component Value Date   VD25OH 35.7 05/01/2022   VD25OH 33.7 12/05/2021   VD25OH 26.7 (L) 08/15/2021   Lab Results  Component Value Date   WBC 8.1 05/01/2022   HGB 13.1 05/01/2022   HCT 40.6 05/01/2022   MCV 89 05/01/2022   PLT 372 05/01/2022   No results found for: "IRON", "TIBC", "FERRITIN"  Attestation Statements:   Reviewed by clinician on day of visit: allergies, medications, problem list, medical history, surgical history, family history, social history, and previous encounter notes.  Time spent on visit including pre-visit chart review and post-visit care and charting was 42 minutes.   I, Madeline Golden, am acting as transcriptionist for Dennard Nip, MD.  I have reviewed the above documentation for accuracy and completeness, and I agree with the above. -  Dennard Nip, MD

## 2022-06-12 ENCOUNTER — Encounter (INDEPENDENT_AMBULATORY_CARE_PROVIDER_SITE_OTHER): Payer: Self-pay | Admitting: Family Medicine

## 2022-06-12 ENCOUNTER — Ambulatory Visit (INDEPENDENT_AMBULATORY_CARE_PROVIDER_SITE_OTHER): Payer: 59 | Admitting: Family Medicine

## 2022-06-12 VITALS — BP 120/80 | HR 75 | Temp 97.7°F | Ht 67.0 in | Wt 219.0 lb

## 2022-06-12 DIAGNOSIS — E669 Obesity, unspecified: Secondary | ICD-10-CM

## 2022-06-12 DIAGNOSIS — Z6833 Body mass index (BMI) 33.0-33.9, adult: Secondary | ICD-10-CM

## 2022-06-12 DIAGNOSIS — E559 Vitamin D deficiency, unspecified: Secondary | ICD-10-CM | POA: Diagnosis not present

## 2022-06-12 MED ORDER — VITAMIN D (ERGOCALCIFEROL) 1.25 MG (50000 UNIT) PO CAPS: 50000.0000 [IU] | ORAL_CAPSULE | ORAL | 0 refills | Status: AC

## 2022-06-19 NOTE — Progress Notes (Unsigned)
Chief Complaint:   OBESITY Madeline Golden is here to discuss her progress with her obesity treatment plan along with follow-up of her obesity related diagnoses. Madeline Golden is on the Category 3 Plan and states she is following her eating plan approximately 0% of the time. Madeline Golden states she is doing 0 minutes 0 times per week.  Today's visit was #: 11 Starting weight: 219 lbs Starting date: 08/15/2021 Today's weight: 219 lbs Today's date: 06/12/2022 Total lbs lost to date: 0 Total lbs lost since last in-office visit: 0  Interim History: Madeline Golden has struggled with weight loss in the last 6 weeks, and she has regained the 3 pounds she has lost.  She was unable to follow her eating plan or exercise while on vacation.  Subjective:   1. Vitamin D deficiency Madeline Golden is on vitamin D prescription, but her level was not yet at goal.  Assessment/Plan:   1. Vitamin D deficiency Madeline Golden agreed to restart prescription vitamin D 50,000 units once weekly, with no refills.  - Vitamin D, Ergocalciferol, (DRISDOL) 1.25 MG (50000 UNIT) CAPS capsule; Take 1 capsule (50,000 Units total) by mouth every 7 (seven) days.  Dispense: 5 capsule; Refill: 0  2. Obesity, Current BMI 34.4 Madeline Golden is currently in the action stage of change. As such, her goal is to continue with weight loss efforts. She has agreed to practicing portion control and making smarter food choices, such as increasing vegetables and decreasing simple carbohydrates.   Behavioral modification strategies: increasing water intake.  Madeline Golden has agreed to follow-up with our clinic in 3 weeks. She was informed of the importance of frequent follow-up visits to maximize her success with intensive lifestyle modifications for her multiple health conditions.   Objective:   Blood pressure 120/80, pulse 75, temperature 97.7 F (36.5 C), height '5\' 7"'$  (1.702 m), weight 219 lb (99.3 kg), SpO2 100 %. Body mass index is 34.3 kg/m.  General: Cooperative, alert,  well developed, in no acute distress. HEENT: Conjunctivae and lids unremarkable. Cardiovascular: Regular rhythm.  Lungs: Normal work of breathing. Neurologic: No focal deficits.   Lab Results  Component Value Date   CREATININE 1.08 (H) 05/01/2022   BUN 12 05/01/2022   NA 137 05/01/2022   K 4.4 05/01/2022   CL 99 05/01/2022   CO2 20 05/01/2022   Lab Results  Component Value Date   ALT 6 05/01/2022   AST 19 05/01/2022   ALKPHOS 68 05/01/2022   BILITOT 0.4 05/01/2022   Lab Results  Component Value Date   HGBA1C 6.1 (H) 05/01/2022   HGBA1C 5.7 (H) 12/05/2021   HGBA1C 6.3 (H) 08/15/2021   Lab Results  Component Value Date   INSULIN 5.3 05/01/2022   INSULIN 4.7 12/05/2021   INSULIN 6.1 08/15/2021   Lab Results  Component Value Date   TSH 2.710 05/01/2022   Lab Results  Component Value Date   CHOL 240 (H) 05/01/2022   HDL 57 05/01/2022   LDLCALC 173 (H) 05/01/2022   TRIG 63 05/01/2022   CHOLHDL 3.4 08/15/2021   Lab Results  Component Value Date   VD25OH 35.7 05/01/2022   VD25OH 33.7 12/05/2021   VD25OH 26.7 (L) 08/15/2021   Lab Results  Component Value Date   WBC 8.1 05/01/2022   HGB 13.1 05/01/2022   HCT 40.6 05/01/2022   MCV 89 05/01/2022   PLT 372 05/01/2022   No results found for: "IRON", "TIBC", "FERRITIN"  Attestation Statements:   Reviewed by clinician on day of visit: allergies,  medications, problem list, medical history, surgical history, family history, social history, and previous encounter notes.  Time spent on visit including pre-visit chart review and post-visit care and charting was 30 minutes.   I, Trixie Dredge, am acting as transcriptionist for Dennard Nip, MD.  I have reviewed the above documentation for accuracy and completeness, and I agree with the above. -  Dennard Nip, MD

## 2022-06-27 ENCOUNTER — Encounter (INDEPENDENT_AMBULATORY_CARE_PROVIDER_SITE_OTHER): Payer: Self-pay

## 2022-07-03 ENCOUNTER — Ambulatory Visit (INDEPENDENT_AMBULATORY_CARE_PROVIDER_SITE_OTHER): Payer: 59 | Admitting: Family Medicine

## 2022-08-10 ENCOUNTER — Other Ambulatory Visit: Payer: Self-pay | Admitting: Internal Medicine

## 2022-08-10 DIAGNOSIS — M542 Cervicalgia: Secondary | ICD-10-CM

## 2022-08-21 ENCOUNTER — Ambulatory Visit: Payer: 59

## 2022-08-22 ENCOUNTER — Ambulatory Visit
Admission: RE | Admit: 2022-08-22 | Discharge: 2022-08-22 | Disposition: A | Discharge status: Home / Self Care | Payer: No Typology Code available for payment source | Source: Ambulatory Visit | Attending: Internal Medicine | Admitting: Internal Medicine

## 2022-08-22 DIAGNOSIS — M542 Cervicalgia: Secondary | ICD-10-CM | POA: Insufficient documentation

## 2022-09-21 ENCOUNTER — Encounter: Payer: Self-pay | Admitting: Chiropractic Medicine

## 2022-09-24 ENCOUNTER — Other Ambulatory Visit: Payer: Self-pay | Admitting: Chiropractic Medicine

## 2022-09-24 DIAGNOSIS — M545 Low back pain, unspecified: Secondary | ICD-10-CM

## 2022-10-01 ENCOUNTER — Ambulatory Visit
Admission: RE | Admit: 2022-10-01 | Discharge: 2022-10-01 | Disposition: A | Discharge status: Home / Self Care | Payer: No Typology Code available for payment source | Source: Ambulatory Visit | Attending: Chiropractic Medicine | Admitting: Chiropractic Medicine

## 2022-10-01 DIAGNOSIS — M545 Low back pain, unspecified: Secondary | ICD-10-CM | POA: Diagnosis present

## 2022-10-10 ENCOUNTER — Ambulatory Visit: Payer: No Typology Code available for payment source | Admitting: Skilled Nursing Facility1

## 2022-11-14 ENCOUNTER — Encounter: Payer: No Typology Code available for payment source | Attending: Endocrinology | Admitting: Skilled Nursing Facility1

## 2022-11-14 ENCOUNTER — Encounter: Payer: Self-pay | Admitting: Skilled Nursing Facility1

## 2022-11-14 VITALS — Ht 67.0 in | Wt 221.6 lb

## 2022-11-14 DIAGNOSIS — E669 Obesity, unspecified: Secondary | ICD-10-CM | POA: Diagnosis not present

## 2022-11-14 NOTE — Progress Notes (Signed)
Medical Nutrition Therapy  Appointment Start time:  2:06 pm  Appointment End time:  3:06 pm  Primary concerns today: overall healthful diet  Referral diagnosis: E66 Preferred learning style: no preference indicated Learning readiness: ready   NUTRITION ASSESSMENT   Clinical Medical Hx: Cancer, anxiety, and thyroid disease Medications: Phentermine  Labs: ALT 10 (low), bilirubin 0.3 (low) Notable Signs/Symptoms: None reported.   Lifestyle & Dietary Hx  Pt states she tosses and turns a lot during the night. 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  Pt states mother usually cooks the family dinner.  Pt walks a minimum of 10,000 steps a day.  Estimated daily fluid intake: 32 oz Supplements: multivitamin, vitamin D, vitamin B12, vitamin C, immune boost, elderberry  Sleep: 4 hours / feels tired throughout the day  Stress / self-care: None reported. Current average weekly physical activity: ADL's, sometimes walks in the morning when not traveling   Anthropometrics   Body Composition Scale 11/14/2022  Current Body Weight 221.6  Total Body Fat % 40.1  Visceral Fat 11  Fat-Free Mass % 59.8   Total Body Water % 44.4  Muscle-Mass lbs 33.8  BMI 34.4  Body Fat Displacement          Torso  lbs 55.0         Left Leg  lbs 11.0         Right Leg  lbs 11.0         Left Arm  lbs 5.5         Right Arm   lbs 5.5     24-Hr Dietary Recall First Meal: sometimes skips or chick-fil-a or Panera bread  Snack: chocolate covered almonds or cookies Second Meal: chick-fil-a or oodles of noodles or [employer] (at work) Media planner: chips or hot funions  Third Meal: fried chicken or pork chops and vegetables and rice or macaroni and cheese  Snack: sweets (cookies or ice cream) Beverages: decaf coffee, water, juice (homemade)   NUTRITION DIAGNOSIS   NB-1.7 Undesireable food choices As related to no prior education from a nutrition professional.  As evidenced by 24-hour recall.   NUTRITION INTERVENTION  Nutrition education (E-1) on the following topics:  Educated pt on how fast foods can effect our GI tract and overall health.  Educated pt on the importance of fiber in the diet and high fiber foods.   Handouts Provided Include  My Plate  Health snack ideas  Learning Style & Readiness for Change Teaching method utilized: Visual & Auditory  Demonstrated degree of understanding via: Teach Back  Barriers to learning/adherence to lifestyle change: None identified.  Goals Established by Pt Increase water intake gradually to 64 oz a day. Increase fiber intake with whole grains, whole vegetables and fruit. Increase physical activity to 2-3 days a week for 30 minutes.  Eat more meals from home throughout the week.   MONITORING & EVALUATION Dietary intake, weekly physical activity.  Next Steps  Patient is to follow-up in 2 months.

## 2022-12-27 ENCOUNTER — Encounter: Payer: No Typology Code available for payment source | Attending: Endocrinology | Admitting: Skilled Nursing Facility1

## 2022-12-27 ENCOUNTER — Encounter: Payer: Self-pay | Admitting: Skilled Nursing Facility1

## 2022-12-27 VITALS — Ht 67.0 in | Wt 220.0 lb

## 2022-12-27 DIAGNOSIS — E6609 Other obesity due to excess calories: Secondary | ICD-10-CM | POA: Insufficient documentation

## 2022-12-27 NOTE — Progress Notes (Signed)
Medical Nutrition Therapy    Primary concerns today: overall healthful diet  Referral diagnosis: E66 Preferred learning style: no preference indicated Learning readiness: ready   NUTRITION ASSESSMENT   Clinical Medical Hx: Cancer, anxiety, and thyroid disease Medications: Phentermine  Labs: ALT 10 (low), bilirubin 0.3 (low) Notable Signs/Symptoms: None reported.   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  Pt states she has only been eating one or two meals a day stating she does not feel hunger.  Pt states she has been walking and is now going to start tai kondo with her son. Pt states since she has added in more vegetables she is having less constipation.    Estimated daily fluid intake: 32 oz Supplements: multivitamin, vitamin D, vitamin B12, vitamin C, immune boost, elderberry  Sleep: 4 hours / feels tired throughout the day  Stress / self-care: None reported. Current average weekly physical activity: ADL's, sometimes walks in the morning when not traveling   Anthropometrics   Body Composition Scale 11/14/2022 12/27/2022  Current Body Weight 221.6 220  Total Body Fat % 40.1 40.4  Visceral Fat 11   Fat-Free Mass % 59.8    Total Body Water % 44.4 44.2  Muscle-Mass lbs 33.8   BMI 34.4   Body Fat Displacement           Torso  lbs 55.0          Left Leg  lbs 11.0          Right Leg  lbs 11.0          Left Arm  lbs 5.5          Right Arm   lbs 5.5      24-Hr Dietary Recall First Meal: skipped or eaten out Snack:  Second Meal: taco bell power bowl or salmon + rice + lima + corn Snack: Third Meal: pigs in a blanket and pork and beans  Snack:  Beverages: decaf coffee, water, 32 ounces-juice (homemade) (beet, kale, ornages, grapefruit, carrots), 16 ounces regular gatorade   NUTRITION DIAGNOSIS  NB-1.7 Undesireable food choices As related to no prior education from a nutrition professional.  As evidenced by 24-hour  recall.   NUTRITION INTERVENTION  Nutrition education (E-1) on the following topics: continued  Educated pt on how fast foods can effect our GI tract and overall health.  Educated pt on the importance of fiber in the diet and high fiber foods.  Educated pt on under consumption and under hydration leading to headaches   Handouts Previously Provided Include  My Plate  Health snack ideas  Learning Style & Readiness for Change Teaching method utilized: Visual & Auditory  Demonstrated degree of understanding via: Teach Back  Barriers to learning/adherence to lifestyle change: None identified.  Goals Established by Pt: continued Increase water intake gradually to 64 oz a day. Increase fiber intake with whole grains, whole vegetables and fruit. Increase physical activity to 2-3 days a week for 30 minutes.  Eat more meals from home throughout the week. NEW: Make soup or muffins out of the pulp from juicing  NEW: eat breakfast and lunch NEW: add in at least another 20 ounces which may help with your headaches you have been having recently    MONITORING & EVALUATION Dietary intake, weekly physical activity.  Next Steps  Patient is to follow-up in 3 months.

## 2023-04-03 ENCOUNTER — Encounter: Payer: Self-pay | Admitting: Skilled Nursing Facility1

## 2023-04-03 ENCOUNTER — Encounter: Payer: No Typology Code available for payment source | Attending: Endocrinology | Admitting: Skilled Nursing Facility1

## 2023-04-03 DIAGNOSIS — E669 Obesity, unspecified: Secondary | ICD-10-CM

## 2023-04-03 NOTE — Progress Notes (Signed)
Medical Nutrition Therapy    Primary concerns today: overall healthful diet  Referral diagnosis: E66 Preferred learning style: no preference indicated Learning readiness: ready   NUTRITION ASSESSMENT   Clinical Medical Hx: Cancer, anxiety, and thyroid disease Medications: Phentermine  Labs: ALT 10 (low), bilirubin 0.3 (low) Notable Signs/Symptoms: None reported.   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   Pt states she and her son has been doing [place] and biking.    Estimated daily fluid intake: 32 oz Supplements: multivitamin, vitamin D, vitamin B12, vitamin C, immune boost, elderberry  Sleep: 4 hours / feels tired throughout the day  Stress / self-care: None reported. Current average weekly physical activity: ADL's, sometimes walks in the morning when not traveling   Anthropometrics   Body Composition Scale 11/14/2022 12/27/2022  Current Body Weight 221.6 220  Total Body Fat % 40.1 40.4  Visceral Fat 11   Fat-Free Mass % 59.8    Total Body Water % 44.4 44.2  Muscle-Mass lbs 33.8   BMI 34.4   Body Fat Displacement           Torso  lbs 55.0          Left Leg  lbs 11.0          Right Leg  lbs 11.0          Left Arm  lbs 5.5          Right Arm   lbs 5.5      24-Hr Dietary Recall First Meal: skipped or eaten out Snack:  Second Meal: taco bell power bowl or salmon + rice + lima + corn or boiled egga nd humus and salad Snack: Third Meal: pigs in a blanket and pork and beans  Snack:  Beverages: decaf coffee, water, 32 ounces-juice (homemade) (beet, kale, ornages, grapefruit, carrots), 16 ounces regular gatorade   NUTRITION DIAGNOSIS  NB-1.7 Undesireable food choices As related to no prior education from a nutrition professional.  As evidenced by 24-hour recall.   NUTRITION INTERVENTION  Nutrition education (E-1) on the following topics: continued  Educated pt on how fast foods can effect our GI tract  and overall health.  Educated pt on the importance of fiber in the diet and high fiber foods.  Educated pt on under consumption and under hydration leading to headaches   Handouts Previously Provided Include  My Plate  Health snack ideas  Learning Style & Readiness for Change Teaching method utilized: Visual & Auditory  Demonstrated degree of understanding via: Teach Back  Barriers to learning/adherence to lifestyle change: None identified.  Goals Established by Pt: continued Increase water intake gradually to 64 oz a day. Increase fiber intake with whole grains, whole vegetables and fruit. Increase physical activity to 2-3 days a week for 30 minutes.  Eat more meals from home throughout the week. continue: Make soup or muffins out of the pulp from juicing  continue: eat breakfast and lunch continue: add in at least another 20 ounces which may help with your headaches you have been having recently    MONITORING & EVALUATION Dietary intake, weekly physical activity.  Next Steps  Patient is to follow-up: call or email with any future questions or concerns

## 2023-04-29 ENCOUNTER — Telehealth: Payer: Self-pay | Admitting: Family

## 2023-04-29 NOTE — Telephone Encounter (Addendum)
Patient is currently at  [facility], requesting a prescription for bras, please list the dx code and fax to # [phone]. Please call patient when script has been faxed.       [facility] [address], [place], [place] [address] Phone: [phone] Fax # [phone]

## 2023-04-29 NOTE — Telephone Encounter (Signed)
PEC contacted office on behalf of Pt to let us know pt requested script for Talitha Givens to [facility]. Fax number [phone].

## 2023-04-30 NOTE — Telephone Encounter (Signed)
Amy is this something that you order?

## 2023-04-30 NOTE — Telephone Encounter (Signed)
Sent to scheduler.  

## 2023-04-30 NOTE — Telephone Encounter (Signed)
Please schedule appointment. Thank you

## 2023-05-02 NOTE — Progress Notes (Signed)
Patient ID: Madeline Golden, female    DOB: 1976/04/27  MRN: [id number]  CC: Bra Prescription  Subjective: Madeline Golden is a 47 y.o. female who presents for bra prescription.  Her concerns today include:  History of breast cancer. Reports double mastectomy with reconstruction in 2011. Reports reconstruction was completed with "stomach fat". Reports left breast rejected "stomach fat" and right breast did well. States left breast is "smaller" than right breast. Reports she has a gel prosthetic for left breast. Today she requests a bra prescription to [place] to [facility]. Reports she heard of this resource from her mother. Also, would like referral back to Oncology for routine care. No further issues/concerns for discussion today.   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   External hemorrhoids 07/05/2021   Excessive or frequent menstruation 07/05/2021   Essential hypertension 07/05/2021   Dysmenorrhea 07/05/2021   Constipation 07/05/2021   Alopecia areata 07/05/2021   Low back pain 07/05/2021   Lumbosacral ligament sprain 07/05/2021   Lump or mass in breast 07/05/2021   Malignant neoplasm of breast (female) (HCC) 07/05/2021   Other ill-defined and unknown causes of morbidity and mortality 07/05/2021   Other acne 07/05/2021   Other disorders of menstruation and other abnormal bleeding from female genital tract 07/05/2021   Pain in thoracic spine 07/05/2021   Prediabetes 07/05/2021   Obesity 07/05/2021   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     cyclobenzaprine (FLEXERIL) 10 MG tablet cyclobenzaprine 10 mg tablet  TAKE 1 TABLET BY MOUTH THREE TIMES DAILY AS NEEDED FOR MUSCLE SPASM     ibuprofen (ADVIL) 600 MG tablet Take 1 tablet (600 mg total) by mouth every 6 (six) hours as needed. 30 tablet 0   Multiple Vitamin (MULTIVITAMIN) tablet Take 1 tablet by mouth daily.     phentermine 15 MG capsule Take 1 capsule by mouth daily.     topiramate (TOPAMAX) 25 MG tablet TAKE ONE TABLET BY MOUTH AT BEDTIME FOR 7 DAYS, THEN TAKE TWO TABLETS AT BEDTIME FOR 7 DAYS, THEN TAKE FOUR TABLETS AT BEDTIME FOR MIGRAINE     vitamin B-12 (CYANOCOBALAMIN) 1000 MCG tablet Take 1,000 mcg by mouth daily. Patient takes 2 tablets daily.     Vitamin D, Ergocalciferol, (DRISDOL) 1.25 MG (50000 UNIT) CAPS capsule Take 1 capsule (50,000 Units total) by mouth every 7 (seven) days. 5 capsule 0   No current facility-administered medications on file prior to visit.    Allergies  Allergen Reactions   Tape Rash   Tegaderm Ag Mesh [Silver] Rash    Social History   Socioeconomic History   Marital status: Married    Spouse name: Not on file   Number of children: Not on file   Years of education: Not on file   Highest education level:  Bachelor's degree (e.g., BA, AB, BS)  Occupational History   Occupation: Emergency planning/management officer  Tobacco Use   Smoking status: Never   Smokeless tobacco: Never  Vaping Use   Vaping Use: Never used  Substance and Sexual Activity   Alcohol use: Yes   Drug use: Never   Sexual activity: Yes  Other Topics Concern   Not on file  Social History Narrative   Not on file   Social Determinants of Health   Financial Resource Strain: Low Risk  (05/02/2023)   Overall Financial Resource Strain (CARDIA)     Difficulty of Paying Living Expenses: Not hard at all  Food Insecurity: No Food Insecurity (05/02/2023)   Hunger Vital Sign    Worried About Running Out of Food in the Last Year: Never true    Ran Out of Food in the Last Year: Never true  Transportation Needs: No Transportation Needs (05/02/2023)   PRAPARE - Administrator, Civil Service (Medical): No    Lack of Transportation (Non-Medical): No  Physical Activity: Insufficiently Active (05/02/2023)   Exercise Vital Sign    Days of Exercise per Week: 2 days    Minutes of Exercise per Session: 20 min  Stress: No Stress Concern Present (05/02/2023)   [employer] of Occupational Health - Occupational Stress Questionnaire    Feeling of Stress : Only a little  Social Connections: Moderately Integrated (05/02/2023)   Social Connection and Isolation Panel [NHANES]    Frequency of Communication with Friends and Family: More than three times a week    Frequency of Social Gatherings with Friends and Family: Twice a week    Attends Religious Services: 1 to 4 times per year    Active Member of [employer] or Organizations: Yes    Attends Engineer, structural: More than 4 times per year    Marital Status: Separated  Intimate Partner Violence: Not on file    Family History  Problem Relation Age of Onset   Cancer Mother    Hyperlipidemia Mother    Hypertension Mother    Diabetes Mother    Breast cancer Mother 67       again at 50   Obesity Mother    Cancer Father    Lung cancer Father    Bone cancer Father        multiple myeloma   Diabetes Maternal Grandmother    Breast cancer Maternal Grandmother        dx in her 59s   Breast cancer Maternal Aunt 73   Throat cancer Maternal Uncle    Lung cancer Paternal Aunt    Cancer Paternal Aunt        NOS   Breast cancer Other        MGM's mother   Breast cancer Other        MGM's maternal aunt    Past Surgical History:  Procedure Laterality Date   ABDOMINAL HYSTERECTOMY      APPENDECTOMY     double mastectomy  2012   hystrectomy with oophrectomy  2015   protective    orbital decompression Right 02/2021    ROS: Review of Systems Negative except as stated above  PHYSICAL EXAM: BP 109/76 (BP Location: Right Arm, Patient Position: Sitting, Cuff Size: Normal)   Pulse 84   Resp 12   Ht 5\' 7"  (1.702 m)   Wt 220 lb (99.8 kg)   SpO2 98%   BMI 34.46 kg/m   Physical Exam HENT:  Head: Normocephalic and atraumatic.     Nose: Nose normal.     Mouth/Throat:     Mouth: Mucous membranes are moist.     Pharynx: Oropharynx is clear.  Eyes:     Extraocular Movements: Extraocular movements intact.     Conjunctiva/sclera: Conjunctivae normal.     Pupils: Pupils are equal, round, and reactive to light.  Cardiovascular:     Rate and Rhythm: Normal rate and regular rhythm.     Pulses: Normal pulses.     Heart sounds: Normal heart sounds.  Pulmonary:     Effort: Pulmonary effort is normal.     Breath sounds: Normal breath sounds.  Musculoskeletal:     Cervical back: Normal range of motion and neck supple.  Neurological:     General: No focal deficit present.     Mental Status: She is alert and oriented to person, place, and time.  Psychiatric:        Mood and Affect: Mood normal.        Behavior: Behavior normal.      ASSESSMENT AND PLAN: 1. History of breast cancer 2. History of bilateral mastectomy 3. History of reconstruction of both breasts - Bra prescribed to [place] to [facility].  - Referral to Hematology / Oncology for further evaluation/management.  - Follow-up with primary provider as scheduled. - Ambulatory referral to Hematology / Oncology - Bra  Patient was given the opportunity to ask questions.  Patient verbalized understanding of the plan and was able to repeat key elements of the plan. Patient was given clear instructions to go to Emergency Department or return to medical center if symptoms don't improve, worsen, or  new problems develop.The patient verbalized understanding.   Orders Placed This Encounter  Procedures   Bra   Ambulatory referral to Hematology / Oncology   Follow-up with primary provider as scheduled.   Rema Fendt, NP

## 2023-05-03 ENCOUNTER — Ambulatory Visit: Payer: 59 | Admitting: Family

## 2023-05-03 ENCOUNTER — Encounter: Payer: Self-pay | Admitting: Family

## 2023-05-03 VITALS — BP 109/76 | HR 84 | Resp 12 | Ht 67.0 in | Wt 220.0 lb

## 2023-05-03 DIAGNOSIS — Z9013 Acquired absence of bilateral breasts and nipples: Secondary | ICD-10-CM | POA: Diagnosis not present

## 2023-05-03 DIAGNOSIS — Z853 Personal history of malignant neoplasm of breast: Secondary | ICD-10-CM | POA: Diagnosis not present

## 2023-05-03 DIAGNOSIS — Z9889 Other specified postprocedural states: Secondary | ICD-10-CM

## 2023-05-03 NOTE — Progress Notes (Signed)
Pt is here for new RX    Requesting rx be sent for a special type of bra

## 2023-05-20 ENCOUNTER — Telehealth: Payer: Self-pay | Admitting: Family

## 2023-05-20 NOTE — Telephone Encounter (Signed)
Misty Stanley calling from [facility] is calling to let Amy know know that an order for Mastectomy Supplies will be fax today.  [phone]

## 2023-05-29 ENCOUNTER — Inpatient Hospital Stay: Payer: No Typology Code available for payment source

## 2023-05-29 ENCOUNTER — Inpatient Hospital Stay
Payer: No Typology Code available for payment source | Attending: Hematology and Oncology | Admitting: Hematology and Oncology

## 2023-05-29 VITALS — BP 104/75 | HR 75 | Temp 97.9°F | Resp 18 | Ht 67.0 in | Wt 223.8 lb

## 2023-05-29 DIAGNOSIS — C50911 Malignant neoplasm of unspecified site of right female breast: Secondary | ICD-10-CM | POA: Diagnosis not present

## 2023-05-29 DIAGNOSIS — Z803 Family history of malignant neoplasm of breast: Secondary | ICD-10-CM | POA: Diagnosis not present

## 2023-05-29 DIAGNOSIS — Z171 Estrogen receptor negative status [ER-]: Secondary | ICD-10-CM | POA: Insufficient documentation

## 2023-05-29 DIAGNOSIS — Z79899 Other long term (current) drug therapy: Secondary | ICD-10-CM | POA: Insufficient documentation

## 2023-05-29 DIAGNOSIS — Z90722 Acquired absence of ovaries, bilateral: Secondary | ICD-10-CM | POA: Diagnosis not present

## 2023-05-29 DIAGNOSIS — Z9013 Acquired absence of bilateral breasts and nipples: Secondary | ICD-10-CM | POA: Diagnosis not present

## 2023-05-29 DIAGNOSIS — Z90710 Acquired absence of both cervix and uterus: Secondary | ICD-10-CM | POA: Insufficient documentation

## 2023-05-29 NOTE — Assessment & Plan Note (Signed)
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  She underwent neoadjuvant chemotherapy per the ECOG 5103 protocol.  She then had that total hysterectomy and bilateral salpingo-oophorectomy.  She was followed by [facility] [place], last visit was in 2016.  She recently moved to [place].  She found out that her mom was recently diagnosed with breast cancer after 20 years and she wanted to establish with medical oncology also because she notices some random pain in her right breast.  No palpable masses.  On physical exam there is no obvious evidence of mass.  However there is no comparison to my exam since this is the first time I am seeing her.  I have hence recommended proceeding with mammogram and ultrasound.  If this does not show any evidence of malignancy, then she can continue to follow-up with [place] as needed since it has been 13 years from the diagnosis.  We will try to call her in about 4 weeks to review these imaging results.  I have also sent an in basket message to our genetics team to see if she had comprehensive genetic testing back in 2011.  If she did not, she is willing to do the comprehensive genetic testing.

## 2023-05-29 NOTE — Progress Notes (Signed)
[facility] CONSULT NOTE  Patient Care Team: Rema Fendt, NP as PCP - General (Nurse Practitioner)  CHIEF COMPLAINTS/PURPOSE OF CONSULTATION:  Newly diagnosed breast cancer  HISTORY OF PRESENTING ILLNESS:  Madeline Golden 47 y.o. female is here because of recent diagnosis of right breast cancer.    I reviewed her records extensively and collaborated the history with the patient.  SUMMARY OF ONCOLOGIC HISTORY: Oncology History  Malignant neoplasm of breast (female) (HCC)  07/05/2021 Initial Diagnosis   Malignant neoplasm of breast (female) (HCC)   05/29/2023 Cancer Staging   Staging form: Breast, AJCC 8th Edition - Clinical: Stage IB (cT1c, cN0, cM0, G3, ER-, PR-, HER2-) - Signed by Rachel Moulds, MD on 05/29/2023 Histologic grading system: 3 grade system    This is a 47 y.o. 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 The patient completed chemotherapy with [facility] for 4 cycles on dose-dense schedule every 2 weeks followed by 12 weekly paclitaxel treatments as adjuvant therapy per protocol ECOG 5103. She completed her last dose of Avastin per protocol, dose 10/10 completed in March 2011. The patient has underwent bilateral mastectomy in June 2011 at the [facility] with no residual disease and on 08/10/2010 she underwent reconstruction with bilateral DIEP FLAPS here, with negative pathology.   The left FLAP failed and was replaced eventually with implant on the same side. She also underwent TAHBSO. She was seen by Dr Sid Falcon at [place] [facility] [place] in 2016. She moved from [place] to [place], establishing with medical Oncology locally. She has felt some intermittent breast pains, but since mom had recurrence 20 yrs out, patient was worried. Mom  had diagnosis of BC in her 33's, maternal aunt had BC in 5's. Maternal grandmother and maternal great grandmother had breast cancer as well. On dad's side, dad had mesothelioma, dad's brother's also had the same, paternal aunt had may be ovarian cancer.  She also has PMH of DVT in 2005, when she went to serve in [place], she was also on [facility] at that time, driving long distances.She completed blood thinners for a yr.  Her mom was recently diagnosed with breast cancer recurrence after 20 years and since she noted some right breast pain, she wanted to establish with medical oncology locally. Rest of the pertinent 10 point ROS reviewed and negative.  MEDICAL HISTORY:  Past Medical History:  Diagnosis Date   Anxiety    Back pain    Cancer (HCC)    Constipation    DVT (deep venous thrombosis) (HCC) 2005   No known cause    Edema of both lower extremities    Family history of breast cancer    Graves disease    Hyperthyroidism    IBS (irritable bowel syndrome)    Infertility, female    Joint pain    Vitamin D deficiency     SURGICAL HISTORY: Past Surgical History:  Procedure Laterality Date   ABDOMINAL HYSTERECTOMY     APPENDECTOMY     double mastectomy  2012   hystrectomy with oophrectomy  2015   protective    orbital decompression Right 02/2021    SOCIAL HISTORY: Social History   Socioeconomic History   Marital status: Married    Spouse name: Not on file   Number of children: Not on  file   Years of education: Not on file   Highest education level: Bachelor's degree (e.g., BA, AB, BS)  Occupational History   Occupation: Emergency planning/management officer  Tobacco Use   Smoking status: Never   Smokeless tobacco: Never  Vaping Use   Vaping Use: Never used  Substance and Sexual Activity   Alcohol use: Yes   Drug use: Never   Sexual activity: Yes  Other Topics Concern   Not on file  Social History Narrative   Not on file   Social Determinants of Health   Financial Resource Strain: Low  Risk  (05/02/2023)   Overall Financial Resource Strain (CARDIA)    Difficulty of Paying Living Expenses: Not hard at all  Food Insecurity: No Food Insecurity (05/02/2023)   Hunger Vital Sign    Worried About Running Out of Food in the Last Year: Never true    Ran Out of Food in the Last Year: Never true  Transportation Needs: No Transportation Needs (05/02/2023)   PRAPARE - Administrator, Civil Service (Medical): No    Lack of Transportation (Non-Medical): No  Physical Activity: Insufficiently Active (05/02/2023)   Exercise Vital Sign    Days of Exercise per Week: 2 days    Minutes of Exercise per Session: 20 min  Stress: No Stress Concern Present (05/02/2023)   [employer] of Occupational Health - Occupational Stress Questionnaire    Feeling of Stress : Only a little  Social Connections: Moderately Integrated (05/02/2023)   Social Connection and Isolation Panel [NHANES]    Frequency of Communication with Friends and Family: More than three times a week    Frequency of Social Gatherings with Friends and Family: Twice a week    Attends Religious Services: 1 to 4 times per year    Active Member of [employer] or Organizations: Yes    Attends Engineer, structural: More than 4 times per year    Marital Status: Separated  Intimate Partner Violence: Not on file    FAMILY HISTORY: Family History  Problem Relation Age of Onset   Cancer Mother    Hyperlipidemia Mother    Hypertension Mother    Diabetes Mother    Breast cancer Mother 59       again at 58   Obesity Mother    Cancer Father    Lung cancer Father    Bone cancer Father        multiple myeloma   Diabetes Maternal Grandmother    Breast cancer Maternal Grandmother        dx in her 4s   Breast cancer Maternal Aunt 73   Throat cancer Maternal Uncle    Lung cancer Paternal Aunt    Cancer Paternal Aunt        NOS   Breast cancer Other        MGM's mother   Breast cancer Other        MGM's maternal aunt     ALLERGIES:  is allergic to tape and tegaderm ag mesh [silver].  MEDICATIONS:  Current Outpatient Medications  Medication Sig Dispense Refill   Ascorbic Acid (VITAMIN C) 1000 MG tablet Take 1,000 mg by mouth daily.     cyclobenzaprine (FLEXERIL) 10 MG tablet cyclobenzaprine 10 mg tablet  TAKE 1 TABLET BY MOUTH THREE TIMES DAILY AS NEEDED FOR MUSCLE SPASM     ibuprofen (ADVIL) 600 MG tablet Take 1 tablet (600 mg total) by mouth every 6 (six) hours as needed. 30 tablet  0   Multiple Vitamin (MULTIVITAMIN) tablet Take 1 tablet by mouth daily.     phentermine 15 MG capsule Take 1 capsule by mouth daily.     topiramate (TOPAMAX) 25 MG tablet TAKE ONE TABLET BY MOUTH AT BEDTIME FOR 7 DAYS, THEN TAKE TWO TABLETS AT BEDTIME FOR 7 DAYS, THEN TAKE FOUR TABLETS AT BEDTIME FOR MIGRAINE     vitamin B-12 (CYANOCOBALAMIN) 1000 MCG tablet Take 1,000 mcg by mouth daily. Patient takes 2 tablets daily.     Vitamin D, Ergocalciferol, (DRISDOL) 1.25 MG (50000 UNIT) CAPS capsule Take 1 capsule (50,000 Units total) by mouth every 7 (seven) days. 5 capsule 0   No current facility-administered medications for this visit.    REVIEW OF SYSTEMS:   Constitutional: Denies fevers, chills or abnormal night sweats Eyes: Denies blurriness of vision, double vision or watery eyes Ears, nose, mouth, throat, and face: Denies mucositis or sore throat Respiratory: Denies cough, dyspnea or wheezes Cardiovascular: Denies palpitation, chest discomfort or lower extremity swelling Gastrointestinal:  Denies nausea, heartburn or change in bowel habits Skin: Denies abnormal skin rashes Lymphatics: Denies new lymphadenopathy or easy bruising Neurological:Denies numbness, tingling or new weaknesses Behavioral/Psych: Mood is stable, no new changes  Breast: Denies any palpable lumps or discharge All other systems were reviewed with the patient and are negative.  PHYSICAL EXAMINATION: ECOG PERFORMANCE STATUS: 0 -  Asymptomatic  Vitals:   05/29/23 1406  BP: 104/75  Pulse: 75  Resp: 18  Temp: 97.9 F (36.6 C)  SpO2: 100%   Filed Weights   05/29/23 1406  Weight: 223 lb 12.8 oz (101.5 kg)    GENERAL:alert, no distress and comfortable NECK: supple, thyroid normal size, non-tender, without nodularity LYMPH:  no palpable lymphadenopathy in the cervical, axillary LUNGS: clear to auscultation and percussion with normal breathing effort BREAST: Postsurgical changes noted in the right breast and left breast.  There is no obvious palpable mass or regional adenopathy.  LABORATORY DATA:  I have reviewed the data as listed Lab Results  Component Value Date   WBC 8.1 05/01/2022   HGB 13.1 05/01/2022   HCT 40.6 05/01/2022   MCV 89 05/01/2022   PLT 372 05/01/2022   Lab Results  Component Value Date   NA 137 05/01/2022   K 4.4 05/01/2022   CL 99 05/01/2022   CO2 20 05/01/2022    RADIOGRAPHIC STUDIES: I have personally reviewed the radiological reports and agreed with the findings in the report.  ASSESSMENT AND PLAN:  Malignant neoplasm of breast (female) [facility]) This is a very pleasant 47 year old postmenopausal female patient with prior history of stage I T1 cN0 M0 grade 3 triple negative invasive ductal carcinoma of the right breast status post bilateral mastectomy and reconstruction, right side has a flap in the left side has an implant because of flap failure.  She underwent neoadjuvant chemotherapy per the ECOG 5103 protocol.  She then had that total hysterectomy and bilateral salpingo-oophorectomy.  She was followed by [facility] of [place], last visit was in 2016.  She recently moved to [place].  She found out that her mom was recently diagnosed with breast cancer after 20 years and she wanted to establish with medical oncology also because she notices some random pain in her right breast.  No palpable masses.  On physical exam there is no obvious evidence of mass.  However there is no  comparison to my exam since this is the first time I am seeing her.  I have hence recommended  proceeding with mammogram and ultrasound.  If this does not show any evidence of malignancy, then she can continue to follow-up with [place] as needed since it has been 13 years from the diagnosis.  We will try to call her in about 4 weeks to review these imaging results.  I have also sent an in basket message to our genetics team to see if she had comprehensive genetic testing back in 2011.  If she did not, she is willing to do the comprehensive genetic testing.   All questions were answered. The patient knows to call the clinic with any problems, questions or concerns.    Rachel Moulds, MD 05/29/23

## 2023-06-26 ENCOUNTER — Telehealth: Payer: Self-pay | Admitting: Adult Health

## 2023-06-26 ENCOUNTER — Inpatient Hospital Stay: Payer: 59 | Admitting: Adult Health

## 2023-06-26 NOTE — Telephone Encounter (Signed)
Called [facility] to f/u on cancellation that was done for pt Madeline Golden. Per [facility], pt needs medical forms filled out and emailed or faxed to the breast center. Pt made aware, pt given information and verbalized understanding.

## 2024-04-28 ENCOUNTER — Other Ambulatory Visit: Payer: Self-pay

## 2024-04-28 ENCOUNTER — Emergency Department
Admission: EM | Admit: 2024-04-28 | Discharge: 2024-04-28 | Disposition: A | Discharge status: Home / Self Care | Attending: Emergency Medicine | Admitting: Emergency Medicine

## 2024-04-28 ENCOUNTER — Emergency Department

## 2024-04-28 DIAGNOSIS — R109 Unspecified abdominal pain: Secondary | ICD-10-CM | POA: Insufficient documentation

## 2024-04-28 DIAGNOSIS — E039 Hypothyroidism, unspecified: Secondary | ICD-10-CM | POA: Insufficient documentation

## 2024-04-28 DIAGNOSIS — Z859 Personal history of malignant neoplasm, unspecified: Secondary | ICD-10-CM | POA: Insufficient documentation

## 2024-04-28 LAB — CBC WITH DIFFERENTIAL/PLATELET
Abs Immature Granulocytes: 0.02 10*3/uL (ref 0.00–0.07)
Basophils Absolute: 0.1 10*3/uL (ref 0.0–0.1)
Basophils Relative: 1 %
Eosinophils Absolute: 0.1 10*3/uL (ref 0.0–0.5)
Eosinophils Relative: 1 %
HCT: 35.1 % — ABNORMAL LOW (ref 36.0–46.0)
Hemoglobin: 11.5 g/dL — ABNORMAL LOW (ref 12.0–15.0)
Immature Granulocytes: 0 %
Lymphocytes Relative: 49 %
Lymphs Abs: 5.2 10*3/uL — ABNORMAL HIGH (ref 0.7–4.0)
MCH: 28.7 pg (ref 26.0–34.0)
MCHC: 32.8 g/dL (ref 30.0–36.0)
MCV: 87.5 fL (ref 80.0–100.0)
Monocytes Absolute: 0.6 10*3/uL (ref 0.1–1.0)
Monocytes Relative: 6 %
Neutro Abs: 4.4 10*3/uL (ref 1.7–7.7)
Neutrophils Relative %: 43 %
Platelets: 345 10*3/uL (ref 150–400)
RBC: 4.01 MIL/uL (ref 3.87–5.11)
RDW: 13.1 % (ref 11.5–15.5)
WBC: 10.4 10*3/uL (ref 4.0–10.5)
nRBC: 0 % (ref 0.0–0.2)

## 2024-04-28 LAB — URINALYSIS, ROUTINE W REFLEX MICROSCOPIC
Bacteria, UA: NONE SEEN
Bilirubin Urine: NEGATIVE
Glucose, UA: NEGATIVE mg/dL
Ketones, ur: NEGATIVE mg/dL
Leukocytes,Ua: NEGATIVE
Nitrite: NEGATIVE
Protein, ur: NEGATIVE mg/dL
Specific Gravity, Urine: 1.012 (ref 1.005–1.030)
pH: 6 (ref 5.0–8.0)

## 2024-04-28 LAB — COMPREHENSIVE METABOLIC PANEL WITH GFR
ALT: 7 U/L (ref 0–44)
AST: 15 U/L (ref 15–41)
Albumin: 4.2 g/dL (ref 3.5–5.0)
Alkaline Phosphatase: 58 U/L (ref 38–126)
Anion gap: 10 (ref 5–15)
BUN: 8 mg/dL (ref 6–20)
CO2: 24 mmol/L (ref 22–32)
Calcium: 9.6 mg/dL (ref 8.9–10.3)
Chloride: 104 mmol/L (ref 98–111)
Creatinine, Ser: 0.86 mg/dL (ref 0.44–1.00)
GFR, Estimated: >60 mL/min (ref >60)
Glucose, Bld: 98 mg/dL (ref 70–99)
Potassium: 3.9 mmol/L (ref 3.5–5.1)
Sodium: 138 mmol/L (ref 135–145)
Total Bilirubin: 0.6 mg/dL (ref 0.0–1.2)
Total Protein: 7.5 g/dL (ref 6.5–8.1)

## 2024-04-28 MED ORDER — ONDANSETRON 4 MG PO TBDP
4.0000 mg | ORAL_TABLET | Freq: Three times a day (TID) | ORAL | 0 refills | Status: AC | PRN
Start: 2024-04-28 — End: ?

## 2024-04-28 MED ORDER — OXYCODONE-ACETAMINOPHEN 5-325 MG PO TABS
1.0000 | ORAL_TABLET | Freq: Once | ORAL | Status: AC
  Administered 2024-04-28: 1 via ORAL
  Filled 2024-04-28: qty 1

## 2024-04-28 MED ORDER — IBUPROFEN 800 MG PO TABS
800.0000 mg | ORAL_TABLET | Freq: Three times a day (TID) | ORAL | 0 refills | Status: AC | PRN
Start: 2024-04-28 — End: ?

## 2024-04-28 MED ORDER — OXYCODONE-ACETAMINOPHEN 5-325 MG PO TABS: 1.0000 | ORAL_TABLET | ORAL | 0 refills | Status: AC | PRN

## 2024-04-28 MED ORDER — KETOROLAC TROMETHAMINE 60 MG/2ML IM SOLN
30.0000 mg | Freq: Once | INTRAMUSCULAR | Status: AC
  Administered 2024-04-28: 30 mg via INTRAMUSCULAR
  Filled 2024-04-28: qty 2

## 2024-04-28 NOTE — ED Provider Notes (Signed)
 [facility] Provider Note    Event Date/Time   First MD Initiated Contact with Patient 04/28/24 [phone]     (approximate)   History   Flank Pain   HPI  Madeline Golden is a 48 y.o. female who presents to the ED from home with a chief complaint of right flank pain x 3 days, worse tonight.  Denies associated fever/chills, chest pain, shortness of breath, abdominal pain, nausea, vomiting, dysuria, hematuria or diarrhea.  Denies fall/trauma/injury.     Past Medical History   Past Medical History:  Diagnosis Date   Anxiety    Back pain    Cancer (HCC)    Constipation    DVT (deep venous thrombosis) (HCC) 2005   No known cause    Edema of both lower extremities    Family history of breast cancer    Graves disease    Hyperthyroidism    IBS (irritable bowel syndrome)    Infertility, female    Joint pain    Vitamin D  deficiency      Active Problem List   Patient Active Problem List   Diagnosis Date Noted   Vitamin D  deficiency 05/01/2022   Hyperlipidemia 05/01/2022   TSH elevation 05/01/2022   Leiomyoma of uterus 07/05/2021   Headache 07/05/2021   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   External hemorrhoids 07/05/2021   Excessive or frequent menstruation 07/05/2021   Essential hypertension 07/05/2021   Dysmenorrhea 07/05/2021   Constipation 07/05/2021   Alopecia areata 07/05/2021   Low back pain 07/05/2021   Lumbosacral ligament sprain 07/05/2021   Lump or mass in breast 07/05/2021   Malignant neoplasm of breast (female) (HCC) 07/05/2021   Other ill-defined and unknown causes of morbidity and mortality 07/05/2021   Other acne 07/05/2021   Other disorders of menstruation and other abnormal bleeding from female genital tract 07/05/2021   Pain in thoracic spine 07/05/2021   Prediabetes 07/05/2021   Obesity 07/05/2021   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   Reason for consultation 11/30/2020   Graves' disease 11/23/2020    Family history of malignant neoplasm of breast    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 Authorizing Provider  Ascorbic Acid (VITAMIN C) 1000 MG tablet Take 1,000 mg by mouth daily.    [provider]  cyclobenzaprine  (FLEXERIL ) 10 MG tablet cyclobenzaprine  10 mg tablet  TAKE 1 TABLET BY MOUTH THREE TIMES DAILY AS NEEDED FOR MUSCLE SPASM    [provider]  ibuprofen  (ADVIL ) 600 MG tablet Take 1 tablet (600 mg total) by mouth every 6 (six) hours as needed. 11/03/20   Wieters, Hallie C, PA-C  Multiple Vitamin (MULTIVITAMIN) tablet Take 1 tablet by mouth daily.    [provider]  phentermine 15 MG capsule Take 1 capsule by mouth daily. 12/17/22 06/19/23  [provider]  topiramate (TOPAMAX) 25 MG tablet TAKE ONE TABLET BY MOUTH AT BEDTIME FOR 7 DAYS, THEN TAKE TWO TABLETS AT  BEDTIME FOR 7 DAYS, THEN TAKE FOUR TABLETS AT BEDTIME FOR MIGRAINE 03/08/23 03/08/24  [provider]  vitamin B-12 (CYANOCOBALAMIN) 1000 MCG tablet Take 1,000 mcg by mouth daily. Patient takes 2 tablets daily.    [provider]  Vitamin D , Ergocalciferol , (DRISDOL ) 1.25 MG (50000 UNIT) CAPS capsule Take 1 capsule (50,000 Units total) by mouth every 7 (seven) days. 06/12/22   Glenora Laos, MD     Allergies  Tape and Tegaderm ag mesh [silver]   Family History   Family History  Problem Relation Age of Onset   Cancer Mother    Hyperlipidemia Mother    Hypertension Mother    Diabetes Mother     Breast cancer Mother 78       again at 61   Obesity Mother    Cancer Father    Lung cancer Father    Bone cancer Father        multiple myeloma   Diabetes Maternal Grandmother    Breast cancer Maternal Grandmother        dx in her 66s   Breast cancer Maternal Aunt 73   Throat cancer Maternal Uncle    Lung cancer Paternal Aunt    Cancer Paternal Aunt        NOS   Breast cancer Other        MGM's mother   Breast cancer Other        MGM's maternal aunt     Physical Exam  Triage Vital Signs: ED Triage Vitals  Encounter Vitals Group     BP 04/28/24 0202 (!) 118/96     Systolic BP Percentile --      Diastolic BP Percentile --      Pulse Rate 04/28/24 0202 89     Resp 04/28/24 0202 18     Temp 04/28/24 0202 98.2 F (36.8 C)     Temp src --      SpO2 04/28/24 0202 98 %     Weight 04/28/24 0200 195 lb (88.5 kg)     Height 04/28/24 0200 5\' 7"  (1.702 m)     Head Circumference --      Peak Flow --      Pain Score 04/28/24 0200 10     Pain Loc --      Pain Education --      Exclude from Growth Chart --     Updated Vital Signs: BP (!) 118/96   Pulse 89   Temp 98.2 F (36.8 C)   Resp 18   Ht 5\' 7"  (1.702 m)   Wt 88.5 kg   SpO2 98%   BMI 30.54 kg/m    General: Awake, no distress.  CV:  RRR.  Good peripheral perfusion.  Resp:  Normal effort.  CTAB. Abd:  Nontender.  No CVAT.  No distention.  Other:  No truncal vesicles.   ED Results / Procedures / Treatments  Labs (all labs ordered are listed, but only abnormal results are displayed) Labs Reviewed  CBC WITH DIFFERENTIAL/PLATELET - Abnormal; Notable for the following components:      Result Value   Hemoglobin 11.5 (*)    HCT 35.1 (*)    Lymphs Abs 5.2 (*)    All other components within normal limits  URINALYSIS, ROUTINE W REFLEX MICROSCOPIC - Abnormal; Notable for the following components:   Color, Urine YELLOW (*)    APPearance CLEAR (*)    Hgb urine dipstick SMALL (*)    All other  components within  normal limits  COMPREHENSIVE METABOLIC PANEL WITH GFR     EKG  None   RADIOLOGY I have independently visualized interpreted patient's imaging study as well as noted the radiology interpretation:  CT renal stone: Negative  Official radiology report(s): CT Renal Stone Study Result Date: 04/28/2024 CLINICAL DATA:  Right-sided flank pain for 3 days, initial encounter EXAM: CT ABDOMEN AND PELVIS WITHOUT CONTRAST TECHNIQUE: Multidetector CT imaging of the abdomen and pelvis was performed following the standard protocol without IV contrast. RADIATION DOSE REDUCTION: This exam was performed according to the departmental dose-optimization program which includes automated exposure control, adjustment of the mA and/or kV according to patient size and/or use of iterative reconstruction technique. COMPARISON:  None Available. FINDINGS: Lower chest: No acute abnormality. Left breast implant is noted. Reconstruction of the right breast is noted as well. Hepatobiliary: No focal liver abnormality is seen. No gallstones, gallbladder wall thickening, or biliary dilatation. Pancreas: Unremarkable. No pancreatic ductal dilatation or surrounding inflammatory changes. Spleen: Normal in size without focal abnormality. Adrenals/Urinary Tract: Adrenal glands are within normal limits. Kidneys are well visualized bilaterally. No renal calculi or obstructive changes are seen. The ureters are within normal limits. Bladder is decompressed. Stomach/Bowel: No obstructive or inflammatory changes of the colon are seen. The appendix has been surgically removed. Small bowel and stomach are unremarkable. Vascular/Lymphatic: No significant vascular findings are present. No enlarged abdominal or pelvic lymph nodes. Reproductive: Status post hysterectomy. No adnexal masses. Other: No abdominal wall hernia or abnormality. No abdominopelvic ascites. Musculoskeletal: No acute or significant osseous findings. IMPRESSION: No acute abnormality  noted. Electronically Signed   By: Violeta Grey M.D.   On: 04/28/2024 02:28     PROCEDURES:  Critical Care performed: No  Procedures   MEDICATIONS ORDERED IN ED: Medications  oxyCODONE-acetaminophen (PERCOCET/ROXICET) 5-325 MG per tablet 1 tablet (1 tablet Oral Given 04/28/24 0400)     IMPRESSION / MDM / ASSESSMENT AND PLAN / ED COURSE  I reviewed the triage vital signs and the nursing notes.                             48 year old female presenting with right flank pain. Differential diagnosis includes, but is not limited to, ovarian cyst, ovarian torsion, acute appendicitis, diverticulitis, urinary tract infection/pyelonephritis, endometriosis, bowel obstruction, colitis, renal colic, gastroenteritis, hernia, fibroids, endometriosis, pregnancy related pain including ectopic pregnancy, etc. I have personally reviewed patient's records and note [facility] with [facility]/2024; I am unfortunately not able to see her records.  Patient's presentation is most consistent with acute complicated illness / injury requiring diagnostic workup.  Laboratory results unremarkable with normal white count and renal function.  Small hemoglobin 0-5 RBC in urine, suspicious of kidney stone.  CT renal colic negative for significant or radiopaque stone.  Pain better after Percocet taken in triage.  Will add IM ketorolac.  Discharged home with as needed Motrin , Percocet, Zofran and patient will follow-up closely with her PCP.  Strict return precautions given.  Patient verbalizes understanding and agrees with plan of care.    FINAL CLINICAL IMPRESSION(S) / ED DIAGNOSES   Final diagnoses:  Right flank pain     Rx / DC Orders   ED Discharge Orders     None        Note:  This document was prepared using Dragon voice recognition software and may include unintentional dictation errors.   Marcea Rojek  J, MD 04/28/24 [phone]

## 2024-04-28 NOTE — Discharge Instructions (Signed)
 You may take pain and nausea medicines as needed.  Drink bottled or filtered water daily.  Return to the ER for worsening symptoms, persistent vomiting, fever or other concerns.

## 2024-04-28 NOTE — ED Triage Notes (Signed)
 Pt reports right side flank pain that began 3 days ago, worse tonight. Pt denies dysuria or difficulty urinating.
